# Patient Record
Sex: Female | Born: 1960 | Race: White | Hispanic: No | Marital: Married | Smoking: Former smoker
Health system: Southern US, Community
[De-identification: ages and names within clinical notes are randomized; demographics above are authoritative.]

## PROBLEM LIST (undated history)

## (undated) DIAGNOSIS — J869 Pyothorax without fistula: Secondary | ICD-10-CM

## (undated) DIAGNOSIS — Z8701 Personal history of pneumonia (recurrent): Secondary | ICD-10-CM

## (undated) DIAGNOSIS — I8390 Asymptomatic varicose veins of unspecified lower extremity: Secondary | ICD-10-CM

## (undated) DIAGNOSIS — E871 Hypo-osmolality and hyponatremia: Secondary | ICD-10-CM

## (undated) HISTORY — PX: VARICOSE VEIN SURGERY: SHX832

## (undated) HISTORY — DX: Personal history of pneumonia (recurrent): Z87.01

## (undated) HISTORY — DX: Hypo-osmolality and hyponatremia: E87.1

## (undated) HISTORY — DX: Asymptomatic varicose veins of unspecified lower extremity: I83.90

## (undated) HISTORY — DX: Pyothorax without fistula: J86.9

---

## 1997-05-10 ENCOUNTER — Inpatient Hospital Stay (HOSPITAL_COMMUNITY): Admission: AD | Admit: 1997-05-10 | Discharge: 1997-05-12 | Payer: Self-pay | Admitting: *Deleted

## 1997-06-20 ENCOUNTER — Other Ambulatory Visit: Admission: RE | Admit: 1997-06-20 | Discharge: 1997-06-20 | Payer: Self-pay | Admitting: *Deleted

## 1997-07-01 ENCOUNTER — Encounter (HOSPITAL_COMMUNITY): Admission: RE | Admit: 1997-07-01 | Discharge: 1997-09-29 | Payer: Self-pay | Admitting: *Deleted

## 1998-02-02 ENCOUNTER — Ambulatory Visit (HOSPITAL_COMMUNITY): Admission: RE | Admit: 1998-02-02 | Discharge: 1998-02-02 | Payer: Self-pay | Admitting: Obstetrics and Gynecology

## 1998-02-02 ENCOUNTER — Encounter: Payer: Self-pay | Admitting: Obstetrics and Gynecology

## 1998-06-26 ENCOUNTER — Other Ambulatory Visit: Admission: RE | Admit: 1998-06-26 | Discharge: 1998-06-26 | Payer: Self-pay | Admitting: Obstetrics and Gynecology

## 1999-07-10 ENCOUNTER — Other Ambulatory Visit: Admission: RE | Admit: 1999-07-10 | Discharge: 1999-07-10 | Payer: Self-pay | Admitting: Obstetrics and Gynecology

## 2000-04-24 ENCOUNTER — Encounter: Payer: Self-pay | Admitting: Vascular Surgery

## 2000-04-28 ENCOUNTER — Encounter (INDEPENDENT_AMBULATORY_CARE_PROVIDER_SITE_OTHER): Payer: Self-pay | Admitting: Specialist

## 2000-04-28 ENCOUNTER — Ambulatory Visit (HOSPITAL_COMMUNITY): Admission: RE | Admit: 2000-04-28 | Discharge: 2000-04-28 | Payer: Self-pay | Admitting: Vascular Surgery

## 2000-08-04 ENCOUNTER — Other Ambulatory Visit: Admission: RE | Admit: 2000-08-04 | Discharge: 2000-08-04 | Payer: Self-pay | Admitting: Obstetrics and Gynecology

## 2001-05-27 ENCOUNTER — Other Ambulatory Visit: Admission: RE | Admit: 2001-05-27 | Discharge: 2001-05-27 | Payer: Self-pay | Admitting: Obstetrics and Gynecology

## 2002-01-06 ENCOUNTER — Inpatient Hospital Stay (HOSPITAL_COMMUNITY): Admission: AD | Admit: 2002-01-06 | Discharge: 2002-01-08 | Payer: Self-pay | Admitting: Obstetrics and Gynecology

## 2002-02-10 ENCOUNTER — Other Ambulatory Visit: Admission: RE | Admit: 2002-02-10 | Discharge: 2002-02-10 | Payer: Self-pay | Admitting: Obstetrics and Gynecology

## 2002-08-02 ENCOUNTER — Encounter: Payer: Self-pay | Admitting: Obstetrics and Gynecology

## 2002-08-02 ENCOUNTER — Ambulatory Visit (HOSPITAL_COMMUNITY): Admission: RE | Admit: 2002-08-02 | Discharge: 2002-08-02 | Payer: Self-pay | Admitting: Obstetrics and Gynecology

## 2003-04-04 ENCOUNTER — Other Ambulatory Visit: Admission: RE | Admit: 2003-04-04 | Discharge: 2003-04-04 | Payer: Self-pay | Admitting: Obstetrics and Gynecology

## 2003-08-11 ENCOUNTER — Ambulatory Visit (HOSPITAL_COMMUNITY): Admission: RE | Admit: 2003-08-11 | Discharge: 2003-08-11 | Payer: Self-pay | Admitting: Obstetrics and Gynecology

## 2004-04-02 ENCOUNTER — Encounter: Admission: RE | Admit: 2004-04-02 | Discharge: 2004-04-02 | Payer: Self-pay | Admitting: Obstetrics and Gynecology

## 2004-08-20 ENCOUNTER — Ambulatory Visit (HOSPITAL_COMMUNITY): Admission: RE | Admit: 2004-08-20 | Discharge: 2004-08-20 | Payer: Self-pay | Admitting: Obstetrics and Gynecology

## 2005-04-17 ENCOUNTER — Encounter: Admission: RE | Admit: 2005-04-17 | Discharge: 2005-04-17 | Payer: Self-pay | Admitting: Obstetrics and Gynecology

## 2005-04-24 ENCOUNTER — Encounter: Admission: RE | Admit: 2005-04-24 | Discharge: 2005-04-24 | Payer: Self-pay | Admitting: Obstetrics and Gynecology

## 2005-05-15 ENCOUNTER — Encounter: Admission: RE | Admit: 2005-05-15 | Discharge: 2005-05-15 | Payer: Self-pay | Admitting: Obstetrics and Gynecology

## 2005-08-25 ENCOUNTER — Ambulatory Visit (HOSPITAL_COMMUNITY): Admission: RE | Admit: 2005-08-25 | Discharge: 2005-08-25 | Payer: Self-pay | Admitting: Obstetrics and Gynecology

## 2005-12-02 ENCOUNTER — Encounter: Admission: RE | Admit: 2005-12-02 | Discharge: 2005-12-02 | Payer: Self-pay | Admitting: Diagnostic Radiology

## 2006-07-02 ENCOUNTER — Encounter: Admission: RE | Admit: 2006-07-02 | Discharge: 2006-07-02 | Payer: Self-pay | Admitting: Obstetrics and Gynecology

## 2007-07-06 ENCOUNTER — Encounter: Admission: RE | Admit: 2007-07-06 | Discharge: 2007-07-06 | Payer: Self-pay | Admitting: Obstetrics and Gynecology

## 2007-12-01 ENCOUNTER — Encounter: Admission: RE | Admit: 2007-12-01 | Discharge: 2007-12-01 | Payer: Self-pay | Admitting: Interventional Radiology

## 2008-03-01 ENCOUNTER — Ambulatory Visit: Payer: Self-pay | Admitting: Vascular Surgery

## 2008-06-21 ENCOUNTER — Ambulatory Visit: Payer: Self-pay | Admitting: Vascular Surgery

## 2008-07-10 ENCOUNTER — Encounter: Admission: RE | Admit: 2008-07-10 | Discharge: 2008-07-10 | Payer: Self-pay | Admitting: Obstetrics and Gynecology

## 2008-10-18 ENCOUNTER — Ambulatory Visit: Payer: Self-pay | Admitting: Vascular Surgery

## 2008-11-01 ENCOUNTER — Ambulatory Visit: Payer: Self-pay | Admitting: Vascular Surgery

## 2009-01-26 ENCOUNTER — Ambulatory Visit: Payer: Self-pay | Admitting: Vascular Surgery

## 2009-07-19 ENCOUNTER — Encounter: Admission: RE | Admit: 2009-07-19 | Discharge: 2009-07-19 | Payer: Self-pay | Admitting: Obstetrics and Gynecology

## 2010-02-10 ENCOUNTER — Encounter: Payer: Self-pay | Admitting: Obstetrics and Gynecology

## 2010-02-10 ENCOUNTER — Encounter: Payer: Self-pay | Admitting: Interventional Radiology

## 2010-05-13 ENCOUNTER — Other Ambulatory Visit: Payer: Self-pay | Admitting: Family Medicine

## 2010-05-13 ENCOUNTER — Ambulatory Visit
Admission: RE | Admit: 2010-05-13 | Discharge: 2010-05-13 | Disposition: A | Discharge status: Home / Self Care | Payer: BC Managed Care – PPO | Source: Ambulatory Visit | Attending: Family Medicine | Admitting: Family Medicine

## 2010-05-13 DIAGNOSIS — J189 Pneumonia, unspecified organism: Secondary | ICD-10-CM

## 2010-06-04 ENCOUNTER — Ambulatory Visit
Admission: RE | Admit: 2010-06-04 | Discharge: 2010-06-04 | Disposition: A | Discharge status: Home / Self Care | Payer: BC Managed Care – PPO | Source: Ambulatory Visit | Attending: Family Medicine | Admitting: Family Medicine

## 2010-06-04 ENCOUNTER — Other Ambulatory Visit: Payer: Self-pay | Admitting: Family Medicine

## 2010-06-04 DIAGNOSIS — J189 Pneumonia, unspecified organism: Secondary | ICD-10-CM

## 2010-06-04 NOTE — Assessment & Plan Note (Signed)
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  She has a large chain of  varicosities beginning in her left groin, extending over the anterior  thigh to the lateral knee and down into her lateral calf and ankle.  She  has worn her compression garments for 3 months.  She reports that these  have not given her any significant improvement.  She continues to have  pain and swelling which makes housework and childcare very difficult.  She has had to stop running for exercise because of the leg pain and  swelling as well.  She had undergone noninvasive vascular laboratory  studies in her last visit in February.  This showed that her great  saphenous vein on the left was patent and did not have any evidence of  reflux.  I explained, therefore, that she would not need any treatment  for her saphenous vein but would recommend a stab phlebectomy of the  extensive varicosities throughout her thigh, calf and down onto her  ankle.  She understands this and will notify us when she can proceed  with this around her regular childcare needs.   Larina Earthly, M.D.  Electronically Signed   TFE/MEDQ  D:  06/21/2008  T:  06/22/2008  Job:  [id number]   cc:   Gretta Arab. Valentina Lucks, M.D.  Sherry A. Rosalio Macadamia, M.D.

## 2010-06-04 NOTE — Procedures (Signed)
LOWER EXTREMITY VENOUS REFLUX EXAM   INDICATION:  Left lower extremity spider veins.   EXAM:  Using color-flow imaging and pulse Doppler spectral analysis, the  left common femoral, superficial femoral, popliteal, posterior tibial,  greater and lesser veins are evaluated.  There is no evidence suggesting  deep venous insufficiency in the left lower extremity.   The left saphenofemoral junction is not competent.  The left GSV is  competent with the caliber as described below.   The left proximal short saphenous vein demonstrates competency.   GSV Diameter (used if found to be incompetent only)                                            Right    Left  Proximal Greater Saphenous Vein           cm       cm  Proximal-to-mid-thigh                     cm       cm  Mid thigh                                 cm       cm  Mid-distal thigh                          cm       cm  Distal thigh                              cm       cm  Knee                                      cm       cm   IMPRESSION:  1. The left greater saphenous vein does not demonstrate reflux.  2. The left greater saphenous vein is not aneurysmal.  3. The left greater saphenous vein is not tortuous.  4. The deep venous system is competent.  5. The left lesser saphenous vein is competent.  6. Sonographic evidence of left lateral greater saphenous vein branch      demonstrates reflux ranging from 0.55 - 0.38 cm from groin to      ankle.        ___________________________________________  Larina Earthly, M.D.   AC/MEDQ  D:  03/01/2008  T:  03/01/2008  Job:  [id number]

## 2010-06-04 NOTE — Assessment & Plan Note (Signed)
OFFICE VISIT   LACINDA, CURVIN  DOB:  1960-07-09                                       11/01/2008  ZOXWR#:[id number]   The patient presents today for followup of her stab phlebectomy of  multiple tributary varicosities from her left groin, lateral thigh and  lateral calf.  She has the usual amount of mild soreness from this.  Did  have significant bruising throughout the area of treatment.  This is  resolving.  I am pleased with her initial result as is the patient.  Plan to see her again in 6 weeks for final followup.   Larina Earthly, M.D.  Electronically Signed   TFE/MEDQ  D:  11/01/2008  T:  11/02/2008  Job:  [id number]

## 2010-06-04 NOTE — Assessment & Plan Note (Signed)
OFFICE VISIT   LEATTA, ALEWINE  DOB:  21-May-1960                                       01/26/2009  ZOXWR#:[id number]   Patient presents today for follow-up of her stab phlebectomy of multiple  tributary varicosities that extended from her left groin over her  anterior thigh to the lateral knee and down her lateral calf.  She has  had good results.   She has resolved the discomfort she had initially after the procedure.  The bruising throughout the treatment area continues to resolve.  The  phlebectomy sites themselves are healing nicely.  She does have some  telangiectasia more distally but understands these could be treated with  sclerotherapy if desired.   She will continue her usual activities and see [place] again on an as-needed  basis.     Larina Earthly, M.D.  Electronically Signed   TFE/MEDQ  D:  01/26/2009  T:  01/29/2009  Job:  [id number]

## 2010-06-04 NOTE — Assessment & Plan Note (Signed)
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  She has a history of prior right leg great saphenous vein ligation and  stripping from her groin to her below knee position on the right and  removal of tributary varicosities by myself in April of 2002.  She has  done well with her right leg.  Since this time over the past 8 years she  has developed additional varicosities over her left anterior thigh  extending down to her lateral calf.  She does not have any history of  deep venous thrombosis.  She does have pain associated with this and  does have swelling distal to this as well.  She is quite active and  reports that she has pain with standing and with activity over this area  of her left thigh and calf.  She did have attempted treatment of this at  an outlying vein center.  Apparently she had attempted ablation of this  area of her anterior thigh with unsuccessful treatment.  This was  approximately 2 years ago.  She does not have any have bleeding  associated with this.   PAST MEDICAL HISTORY:  Is otherwise unremarkable.   MEDICATIONS:  Her only medications Loestrin.   ALLERGIES:  She has no known drug allergies.   She does not have a history of diabetes, hypertension or cardiac  disease.   FAMILY HISTORY:  Is negative for premature atherosclerotic disease.   SOCIAL HISTORY:  She is married with 3 children.  Her husband is Dr.  Donzetta Starch with [employer].  She does not smoke, having quit  in 1991 and has a glass of wine per day.   REVIEW OF SYSTEMS:  Weight is reported 130 pounds.  She is 5 feet 8  inches tall.  Review of systems is otherwise completely negative.   PHYSICAL EXAM:  General:  A well-developed, well-nourished white female  appearing her stated age of 62.  Vital signs:  Her blood  pressure is  121/72, pulse 62.  Her radial and dorsalis pedis pulses are 2+  bilaterally.  Her right leg does have some scattered telangiectasia but  no significant varicosities.  On the left leg she has marked  varicosities beginning in her groin extending over her anterior thigh to  her lateral knee and then down her lateral calf.   She underwent a formal venous duplex today in our office and this  reveals normal competent left great saphenous vein.  The anterior branch  across her thigh does arise at the saphenofemoral junction but does not  have any common trunk with the great saphenous vein.  She does have  reflux throughout this tributary branch.  I discussed options with the  patient.  I explained that our recommendation would be stab phlebectomy  for relief of the discomfort related to her venous varicosities.  I  would not recommend any attempt at ablation of her great saphenous vein  since this is normal and also do not feel she could have successful  ablation of this area.  It would have better functional recovery with  stab phlebectomy.  I explained that the procedure would be done as  an  outpatient in our office under local anesthesia and would take  approximately 1 hour.  She had worn graduated compression garments in  the past but had not worn these recently.  She was given a prescription  today for thigh-high 20-30 mmHg graduated compression garments.  She  will begin using these to determine if this does give her symptomatic  relief of the pain associated with her varicosities over her left leg.  We will see her again in 3 months for further discussion.   Larina Earthly, M.D.  Electronically Signed   TFE/MEDQ  D:  03/01/2008  T:  03/02/2008  Job:  [id number]   cc:   Gretta Arab. Valentina Lucks, M.D.  Sherry A. Rosalio Macadamia, M.D.

## 2010-06-07 NOTE — Op Note (Signed)
[place]  Patient:    Kiara [name], Kiara [name]                        MRN: [id number] Adm. Date:  [id number] Attending:  Alyson Locket                           Operative Report  PROCEDURE:  Right arm arteriovenous fistula creation.  SURGEON:  Larina Earthly, M.D.  ASSISTANT:  Nurse.  ANESTHESIA:  1% lidocaine local.  COMPLICATIONS:  None.  DISPOSITION:  To recovery room stable.  DESCRIPTION OF PROCEDURE:  The patient was taken to the operating room and placed in the supine position, where the area of the right arm was prepped and draped in the usual sterile fashion.  Incision was made over the level of the cephalic vein at the wrist, and the vein was exposed and found to be unusable, too small for bypass.  For this reason, a separate incision was made using local anesthesia over the antecubital space, carried down to isolate the cephalic vein, which was of good caliber.  The vein was mobilized, ligated distally with 2-0 silk ties, and divided.  The vein was mobilized to the level of the brachial artery at the antecubital space.  The artery was occluded proximally and distally and was opened with an 11 blade and extended longitudinally with Potts scissors.  The graft was sewn end-to-side to the artery with running 6-0 Prolene suture.  Clamps were removed, and an excellent thrill was noted.  The wounds were irrigated with saline and hemostased with electrocautery.  Wounds were closed with 3-0 Vicryl in the subcutaneous and subcuticular tissue.  Benzoin and Steri-Strips were applied. DD:  04/28/00 TD:  04/28/00 Job: [id number]

## 2010-06-07 NOTE — Op Note (Signed)
[place]  Patient:    Kiara [name], Kiara [name]                        MRN: [id number] Proc. Date: 04/28/00 Adm. Date:  [id number] Attending:  Alyson Locket                           Operative Report  PREOPERATIVE DIAGNOSIS:  Bilateral painful venous varicosities.  POSTOPERATIVE DIAGNOSIS:  Bilateral painful venous varicosities.  PROCEDURES: 1. Right greater saphenous vein ligation and stripping from the groin to the    below-knee position. 2. Tributary varicosity removal with Trivex system.  SURGEON:  Larina Earthly, M.D.  ASSISTANT:  Sherrie George, P.A.  ANESTHESIA:  LMA.  COMPLICATIONS:  None.  DISPOSITION:  To recovery room stable.  DESCRIPTION OF PROCEDURE:  The patient was taken to the operating room and placed in the supine position, where the area of the right groin and right leg were prepped and draped in the usual sterile fashion.  An incision was made over the saphenofemoral junction and carried down to isolate the saphenofemoral junction.  Tributary branches were ligated with 3-0 Vicryl ties and divided.  The saphenous vein was doubly ligated at its takeoff from the femoral vein.  The vein was encircled with a Vicryl tie, and incision was made in the vein near the saphenofemoral junction.  The stripper was passed retrograde through the incompetent valves down to the level of the below-knee position.  A separate incision was made at this level, and the saphenous vein was ligated at this level and divided, and the stripper was then exposed through the same incision.  Next, using the Trivex system, separate stab incisions were made around the premarked varicosities.  Using tumescent solution with epinephrine and lidocaine and subcutaneous illumination, the Trivex system was used to remove the tributary saphenous veins over the lateral and medial calf and below-knee position.  These also extended down to the dorsum of the foot and  ankle, and these were removed as well.  Hemostasis was obtained with pressure.  After all tributary varicosities were removed, the saphenous vein was stripped using a vein pressure, and pressure was held for hemostasis.  The proximal and distal incision for the greater saphenous vein stripping were closed with subcutaneous Vicryl sutures.  The Trivex avulsion incisions were closed with benzoin and Steri-Strips.  A Kerlix and Coban dressing were applied, and the patient was taken to the recovery room in stable condition. DD:  04/28/00 TD:  04/28/00 Job: [id number]

## 2010-06-07 NOTE — H&P (Signed)
   NAMEMarland [name]  [name], [name]                           ACCOUNT NO.:  [id number]   MEDICAL RECORD NO.:  [id number]                   PATIENT TYPE:  INP   LOCATION:  9172                                 FACILITY:  [facility]   PHYSICIAN:  Lenoard Aden, M.D.             DATE OF BIRTH:  22-Jun-1960   DATE OF ADMISSION:  01/06/2002  DATE OF DISCHARGE:                                HISTORY & PHYSICAL   CHIEF COMPLAINT:  Pregnancy induced hypertension at term.   HISTORY OF PRESENT ILLNESS:  The patient is a 50 year old white female, G3,  P2, EDD of January 12, 2002 at 39+ weeks and blood pressure 120-130/90 in  the office today.   PAST MEDICAL HISTORY:  History of LATEX sensitivity.   MEDICATIONS:  Prenatal vitamins.   PAST OBSTETRICAL HISTORY:  History of a 7 pound 4 ounce female in 1996 and a  7 pound 10 ounce female in 1999.   PAST SURGICAL HISTORY:  1. History of varicose vein surgery.  2. History of surgery on her left arm.   No other medical or surgical hospitalizations.   FAMILY HISTORY:  Myocardial infarction, hypertension, leukemia, bone cancer,  and sarcoma.   PRENATAL LABORATORY DATA:  Blood type is A positive.  Rubella immune.  Hepatitis B surface antigen negative.  HIV nonreactive.  GBS positive.   PHYSICAL EXAMINATION:  GENERAL:  Well-developed, well-nourished white female  in no apparent distress.  VITAL SIGNS:  Blood pressure 121/75.  HEENT:  Normal.  LUNGS:  Clear.  HEART:  Regular rhythm.  ABDOMEN:  Soft.  Gravid.  Nontender.  Estimated fetal weight on ultrasound  today 8 pounds 5 ounces.  CERVICAL EXAMINATION:  Cervix is 3 cm, 80%, vertex and 0 station.  EXTREMITIES:  Show no cord.  NEUROLOGIC EXAMINATION:  Nonfocal.   IMPRESSION:  1. Thirty-nine week intrauterine pregnancy.  2. Pregnancy induced hypertension.  3. Advanced maternal age with normal amniocentesis.   PLAN:  1. Admit.  2. Penicillin prophylaxis.  3. Low-dose Pitocin.  4. Anticipated attempts at  vaginal delivery.  5. Epidural p.r.n.                                                 Lenoard Aden, M.D.    RJT/MEDQ  D:  01/06/2002  T:  01/07/2002  Job:  [id number]

## 2010-06-14 ENCOUNTER — Other Ambulatory Visit: Payer: Self-pay | Admitting: Family Medicine

## 2010-06-14 DIAGNOSIS — Z1231 Encounter for screening mammogram for malignant neoplasm of breast: Secondary | ICD-10-CM

## 2010-06-18 ENCOUNTER — Other Ambulatory Visit: Payer: Self-pay | Admitting: Family Medicine

## 2010-06-18 ENCOUNTER — Ambulatory Visit
Admission: RE | Admit: 2010-06-18 | Discharge: 2010-06-18 | Disposition: A | Discharge status: Home / Self Care | Payer: BC Managed Care – PPO | Source: Ambulatory Visit | Attending: Family Medicine | Admitting: Family Medicine

## 2010-06-18 DIAGNOSIS — J189 Pneumonia, unspecified organism: Secondary | ICD-10-CM

## 2010-06-19 ENCOUNTER — Other Ambulatory Visit: Payer: Self-pay | Admitting: Family Medicine

## 2010-06-19 DIAGNOSIS — R9389 Abnormal findings on diagnostic imaging of other specified body structures: Secondary | ICD-10-CM

## 2010-06-20 ENCOUNTER — Ambulatory Visit
Admission: RE | Admit: 2010-06-20 | Discharge: 2010-06-20 | Disposition: A | Discharge status: Home / Self Care | Payer: BC Managed Care – PPO | Source: Ambulatory Visit | Attending: Family Medicine | Admitting: Family Medicine

## 2010-06-20 DIAGNOSIS — R9389 Abnormal findings on diagnostic imaging of other specified body structures: Secondary | ICD-10-CM

## 2010-06-20 MED ORDER — IOHEXOL 300 MG/ML  SOLN
75.0000 mL | Freq: Once | INTRAMUSCULAR | Status: AC | PRN
  Administered 2010-06-20: 75 mL via INTRAVENOUS

## 2010-06-25 ENCOUNTER — Encounter (INDEPENDENT_AMBULATORY_CARE_PROVIDER_SITE_OTHER): Payer: BC Managed Care – PPO | Admitting: Thoracic Surgery

## 2010-06-25 DIAGNOSIS — J869 Pyothorax without fistula: Secondary | ICD-10-CM

## 2010-06-26 NOTE — Letter (Signed)
June 25, 2010  Gretta Arab. Valentina Lucks, MD [address] [place] [address]  Re:  Kiara [name], Kiara [name]                 DOB:  1960/07/05  Dear Consuella Lose,  I appreciate the opportunity of seeing this patient.  This is a 50 year old patient who was down in [place] where she developed left lower lobe pneumonia and pleuritic chest pain and had a thoracentesis there and was also had hyponatremia with sodiums of 126-127.  She was admitted to [facility] in [place], [place].  After that, she was seen in April 2010, after that she came home and was placed on antibiotics and a chest x-ray showed a persistent left pleural effusion.  The CT scan done on May 31, showed a left lower lobe loculated empyema with chronic empyema with pleural thickening.  She still has a sodium of 132.  She is a Designer, fashion/clothing.  She is referred here for evaluation.  MEDICATIONS:  Ibuprofen, Claritin, and Loestrin.  ALLERGIES:  She is allergic to LATEX.  FAMILY HISTORY:  Noncontributory.  SOCIAL HISTORY:  She was in my practice about 12 years ago, did practice here in [place].  Quit smoking 20 years ago.  Does not drink alcohol on a regular basis.  REVIEW OF SYSTEMS:  VITAL SIGNS:  She has had some weight loss.  She is 130 pounds.  She 5 feet 8 inches. CARDIAC:  She has shortness of breath with exertion. PULMONARY:  As go. GI:  No nausea, vomiting, constipation, diarrhea. GU:  No kidney disease, dysuria or frequent urination. VASCULAR:  No claudication, DVT, or TIAs.  She has had surgery for varicose veins, Dr. Pete Pelt. NEUROLOGICAL:  No dizziness, headaches, or blackouts. MUSCULOSKELETAL:  Arthritis. PSYCHIATRIC:  No depression or nervous. EYE/ENT:  No change in eyesight or hearing. HEMOLOGICAL:  No problems with bleeding and clotting disorders.  PHYSICAL EXAMINATION:  GENERAL:  She is a well-developed Caucasian female in no acute distress.  VITAL  SIGNS:  Her blood pressure is 112/72, pulse 70, respirations 18, and sats were 97%.  HEAD, EYES, EARS, NOSE, AND THROAT:  Unremarkable.  NECK:  Supple without thyromegaly. There is no supraclavicular or axillary adenopathy.  CHEST:  Clear to auscultation and percussion.  HEART:  Regular sinus rhythm.  No murmur. There is no hepatosplenomegaly.  EXTREMITIES:  Pulses are 2+.  There is no clubbing or edema.  NEUROLOGICAL:  She is oriented x3.  Sensory and motor intact.  Cranial nerves intact.  Unfortunately, I feel that she is going to need a decortication since she has got a chronic empyema and we will plan routinely schedule this to do on June 11 at [facility].  She will be in the hospital approximately 4-6 days.  I have explained the risk of procedure to her and she understands and agrees to surgery.  I appreciate the opportunity of seeing the patient.  Ines Bloomer, M.D. Electronically Signed  DPB/MEDQ  D:  06/25/2010  T:  06/26/2010  Job:  [id number]

## 2010-06-27 ENCOUNTER — Other Ambulatory Visit (HOSPITAL_COMMUNITY): Payer: BC Managed Care – PPO

## 2010-07-03 ENCOUNTER — Other Ambulatory Visit: Payer: Self-pay | Admitting: Thoracic Surgery

## 2010-07-03 ENCOUNTER — Ambulatory Visit (HOSPITAL_COMMUNITY)
Admission: RE | Admit: 2010-07-03 | Discharge: 2010-07-03 | Disposition: A | Discharge status: Home / Self Care | Payer: BC Managed Care – PPO | Source: Ambulatory Visit | Attending: Thoracic Surgery | Admitting: Thoracic Surgery

## 2010-07-03 ENCOUNTER — Encounter (HOSPITAL_COMMUNITY)
Admission: RE | Admit: 2010-07-03 | Discharge: 2010-07-03 | Disposition: A | Discharge status: Home / Self Care | Payer: BC Managed Care – PPO | Source: Ambulatory Visit | Attending: Thoracic Surgery | Admitting: Thoracic Surgery

## 2010-07-03 DIAGNOSIS — J90 Pleural effusion, not elsewhere classified: Secondary | ICD-10-CM | POA: Insufficient documentation

## 2010-07-03 DIAGNOSIS — J869 Pyothorax without fistula: Secondary | ICD-10-CM

## 2010-07-03 DIAGNOSIS — Z01812 Encounter for preprocedural laboratory examination: Secondary | ICD-10-CM | POA: Insufficient documentation

## 2010-07-03 DIAGNOSIS — Z01818 Encounter for other preprocedural examination: Secondary | ICD-10-CM | POA: Insufficient documentation

## 2010-07-03 LAB — SURGICAL PCR SCREEN
MRSA, PCR: POSITIVE — AB
Staphylococcus aureus: POSITIVE — AB

## 2010-07-03 LAB — BLOOD GAS, ARTERIAL
Acid-base deficit: 1.3 mmol/L (ref 0.0–2.0)
O2 Saturation: 97.3 %
pO2, Arterial: 88 mmHg (ref 80.0–100.0)

## 2010-07-03 LAB — COMPREHENSIVE METABOLIC PANEL
ALT: 12 U/L (ref 0–35)
BUN: 14 mg/dL (ref 6–23)
CO2: 21 mEq/L (ref 19–32)
Calcium: 8.8 mg/dL (ref 8.4–10.5)
Creatinine, Ser: 0.56 mg/dL (ref 0.4–1.2)
GFR calc Af Amer: >60 mL/min (ref >60)
GFR calc non Af Amer: >60 mL/min (ref >60)
Glucose, Bld: 89 mg/dL (ref 70–99)

## 2010-07-03 LAB — URINALYSIS, ROUTINE W REFLEX MICROSCOPIC
Bilirubin Urine: NEGATIVE
Ketones, ur: NEGATIVE mg/dL
Protein, ur: NEGATIVE mg/dL
Specific Gravity, Urine: 1.004 — ABNORMAL LOW (ref 1.005–1.030)
pH: 7 (ref 5.0–8.0)

## 2010-07-03 LAB — URINE MICROSCOPIC-ADD ON

## 2010-07-03 LAB — CBC
HCT: 38.7 % (ref 36.0–46.0)
MCV: 91.5 fL (ref 78.0–100.0)
RDW: 12.9 % (ref 11.5–15.5)

## 2010-07-03 LAB — ABO/RH: ABO/RH(D): A POS

## 2010-07-03 NOTE — H&P (Signed)
NAMEMarland Kitchen  Kiara [name], Kiara [name] NO.:  [id number]  MEDICAL RECORD NO.:  [id number]  LOCATION:                                 FACILITY:  PHYSICIAN:  Ines Bloomer, M.D.      DATE OF BIRTH:  DATE OF ADMISSION: DATE OF DISCHARGE:                             HISTORY & PHYSICAL   CHIEF COMPLAINT:  Empyema.  HISTORY OF PRESENT ILLNESS:  This is a 50 year old Caucasian female who lives in [place] with her kids on spring break when she developed left chest pain and fever and chills and was admitted to [facility] with a left lower lobe pneumonia and sepsis, required a thoracentesis. She also had hyponatremia at that time and was discharged and referred back to her medical doctor, Dr. Maurice Small.  Chest x-ray on May 17, 2010, showed a persistent left pleural effusion which remained on followup chest x-ray, the CT scan on May 31 showed a 10 x 8 cm lesion in the posterior segment of the left lower lobe and near the superior segment of the left lower lobe with marked scarring of the left lower lobe.  He had no recent fever, chills, though had some mild left chest pain.  She was treated with Rocephin and IV Levaquin.  PAST MEDICAL HISTORY:  She has had varicose vein surgeries by Dr. Gretta Began.  ALLERGIES:  She is allergic to LATEX.  MEDICATION:  Include ibuprofen, Claritin, Elestrin.  FAMILY HISTORY:  Noncontributory.  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  She smoked 20 years ago. Does not drink.  Has occasional alcohol intake.  REVIEW OF SYSTEMS:  She is 130 pounds and she is 5 feet 8 inches.  She has had some weight loss of 4 pounds with the illness.  CARDIA:  No angina or atrial fibrillation.  PULMONARY:  Shortness of breath with exertion.  No hemoptysis, asthma or bronchitis.  GI:  No nausea, vomiting, constipation, diarrhea.  GU:  No kidney disease, dysuria or frequent  urination.  VASCULAR:  No claudication, DVT, TIAs and has previous varicose vein surgery.  NEUROLOGICAL:  No dizziness, headaches, blackouts, seizures.  MUSCULOSKELETAL:  No arthritis or joint pain. PSYCHIATRIC:  No depression or nervousness.  ENT:  No change in eyesight or hearing.  HEMATOLOGICAL:  No problems with bleeding, clotting disorders or anemia.  PHYSICAL EXAMINATION:  VITAL SIGNS:  Her blood pressure is 112/72, pulse 70, respirations 18, sats were 98%. HEENT:  Head is atraumatic.  Eyes:  Pupils equal, reactive to light and accommodation.  Extraocular movements are normal.  Ears: Tympanic membranes are intact.  Nose: There is no septal deviation.  Throat: Without lesion. NECK:  Supple without thyromegaly.  There is no supraclavicular or axillary adenopathy. CHEST:  Decreased breath sounds left lower lobe. HEART:  Regular sinus rhythm.  No murmur. ABDOMEN:  Soft.  No hepatosplenomegaly. EXTREMITIES:  Pulses 2+.  There is no clubbing or edema. NEUROLOGICAL:  He is oriented x3.  Sensory and motor intact.  Cranial nerves intact.  IMPRESSION: 1. Chronic empyema left lower lobe, status post left lower lobe  pneumonia. 2. Hyponatremia.  PLAN:  Left VATS with left decortication.     Ines Bloomer, M.D.     DPB/MEDQ  D:  06/25/2010  T:  06/26/2010  Job:  [id number]  Electronically Signed by Jovita Gamma M.D. on 07/03/2010 04:56:01 PM

## 2010-07-05 ENCOUNTER — Inpatient Hospital Stay (HOSPITAL_COMMUNITY): Payer: BC Managed Care – PPO

## 2010-07-05 ENCOUNTER — Inpatient Hospital Stay (HOSPITAL_COMMUNITY)
Admission: RE | Admit: 2010-07-05 | Discharge: 2010-07-10 | DRG: 075 | Disposition: A | Discharge status: Home / Self Care | Payer: BC Managed Care – PPO | Source: Ambulatory Visit | Attending: Thoracic Surgery | Admitting: Thoracic Surgery

## 2010-07-05 ENCOUNTER — Other Ambulatory Visit: Payer: Self-pay | Admitting: Thoracic Surgery

## 2010-07-05 DIAGNOSIS — Z5332 Thoracoscopic surgical procedure converted to open procedure: Secondary | ICD-10-CM

## 2010-07-05 DIAGNOSIS — Z91040 Latex allergy status: Secondary | ICD-10-CM

## 2010-07-05 DIAGNOSIS — J869 Pyothorax without fistula: Secondary | ICD-10-CM

## 2010-07-05 DIAGNOSIS — E871 Hypo-osmolality and hyponatremia: Secondary | ICD-10-CM | POA: Diagnosis present

## 2010-07-05 DIAGNOSIS — R091 Pleurisy: Secondary | ICD-10-CM | POA: Diagnosis present

## 2010-07-05 DIAGNOSIS — Z87891 Personal history of nicotine dependence: Secondary | ICD-10-CM

## 2010-07-05 LAB — GRAM STAIN

## 2010-07-06 ENCOUNTER — Inpatient Hospital Stay (HOSPITAL_COMMUNITY): Payer: BC Managed Care – PPO

## 2010-07-06 HISTORY — PX: OTHER SURGICAL HISTORY: SHX169

## 2010-07-06 LAB — CBC
Hemoglobin: 10.4 g/dL — ABNORMAL LOW (ref 12.0–15.0)
MCH: 31.8 pg (ref 26.0–34.0)
RBC: 3.27 MIL/uL — ABNORMAL LOW (ref 3.87–5.11)

## 2010-07-06 LAB — POCT I-STAT 3, ART BLOOD GAS (G3+)
Bicarbonate: 21.5 mEq/L (ref 20.0–24.0)
O2 Saturation: 97 %
pCO2 arterial: 37.2 mmHg (ref 35.0–45.0)
pO2, Arterial: 88 mmHg (ref 80.0–100.0)

## 2010-07-06 LAB — BASIC METABOLIC PANEL
CO2: 23 mEq/L (ref 19–32)
Calcium: 7.5 mg/dL — ABNORMAL LOW (ref 8.4–10.5)
Glucose, Bld: 146 mg/dL — ABNORMAL HIGH (ref 70–99)
Potassium: 3.7 mEq/L (ref 3.5–5.1)
Sodium: 132 mEq/L — ABNORMAL LOW (ref 135–145)

## 2010-07-06 NOTE — Op Note (Signed)
Kiara [name], Kiara [name] NO.:  [id number]  MEDICAL RECORD NO.:  [id number]  LOCATION:  2311                         FACILITY:  [facility]  PHYSICIAN:  Ines Bloomer, M.D. DATE OF BIRTH:  July 01, 1960  DATE OF PROCEDURE:  07/05/2010 DATE OF DISCHARGE:                              OPERATIVE REPORT   PREOPERATIVE DIAGNOSIS:  Chronic empyema.  POSTOPERATIVE DIAGNOSIS:  Chronic empyema.  OPERATION PERFORMED:  Attempted left video-assisted thoracoscopic surgery, left thoracotomy, decortication of left lower lobe and resection of empyema cavity.  SURGEON:  Ines Bloomer, MD  ANESTHESIA:  General anesthesia.  After percutaneous insertion of all monitoring lines, the patient underwent general anesthesia, was turned to left lateral thoracotomy position.  This patient had an empyema with pneumonia of the left lower lobe approximately 8 weeks before and then developed effusion which was tapped and then it reoccurred and then had an area of a chronic posterior loculated area, that it was thought to be an empyema cavity in the left lower lobe with scarring of the left lower lobe.  A dual-lumen tube was inserted, the left lung was deflated.  Two trocar sites were made in the anterior and posterior axillary line at the eighth intercostal space and an attempt was made to insert a trocar but on insertion, we feel that it was just a marked amount of adhesions and you could not even get the trocar placed and could not free up the adhesions. So, we then went to a posterolateral thoracotomy to the sixth intercostal space and there was dense adhesions in which we had to carefully dissect off the lung extrapleurally, superiorly and inferiorly, medially and laterally so we were able to get into the space medially, we were able to find the tissue and dissect the left upper lobe and the left lower lobe was divided with the fissure.  We had to use a stapler and partially divide the  fissure.  After we freed the left upper lobe, hence we did not feel we need to do any further lysis of adhesions in the left upper lobe though we first freed up the left lower lobe superior segment and then the medial segment and we dissected laterally and found the empyema cavity which was about 8 cm x 6 cm.  We carefully dissected the lung off the cavity, it was in the lateral wall between the sixth and the eighth ribs and then we completely decorticated the left lower lobe removing all scar tissue off the left lower lobe which sharp and blunt dissection using electrocautery scissors and Kitners.  As the left lower lobe was freed up, there was 2 or 3 areas of tear in the left lower lobe which we repaired with 3-0 Vicryl in interrupted and figure-of-eight fashion.  We next turned our attention to the empyema. We  dissected out extrapleurally, but it was extremely difficult to dissect out and there was some lung stuck inferiorly off this which we divided with a Covidien 45 stapler freeing up the lung and then slowly consisting an extrapleural resection of this cavity from inferiorly to superiorly and medially from laterally to medially and finally dividing the last attachments  medially.  The cavity was then sent for permanent section.  Two chest tubes were placed 24 anteriorly and 28 posteriorly and suture in place to the trocar sites. Single On-Q was done in the usual fashion.  A Marcaine block was done in the usual fashion.  Chest was closed with 4 pericostal drilling through the seventh rib and passed around the sixth rib and #1 Vicryl in the muscle layer, 3-0 Vicryl in the subcutaneous tissue and Dermabond for the skin.  The patient was returned to the recovery room in stable condition.     Ines Bloomer, M.D.     DPB/MEDQ  D:  07/05/2010  T:  07/06/2010  Job:  [id number]  cc:   Gretta Arab. Valentina Lucks, M.D.  Electronically Signed by Jovita Gamma M.D. on 07/06/2010 08:15:15 PM

## 2010-07-07 ENCOUNTER — Inpatient Hospital Stay (HOSPITAL_COMMUNITY): Payer: BC Managed Care – PPO

## 2010-07-07 LAB — CBC
HCT: 29.8 % — ABNORMAL LOW (ref 36.0–46.0)
Hemoglobin: 10.1 g/dL — ABNORMAL LOW (ref 12.0–15.0)
MCH: 31.9 pg (ref 26.0–34.0)
MCHC: 33.9 g/dL (ref 30.0–36.0)
MCV: 94 fL (ref 78.0–100.0)

## 2010-07-07 LAB — COMPREHENSIVE METABOLIC PANEL
Alkaline Phosphatase: 49 U/L (ref 39–117)
BUN: 9 mg/dL (ref 6–23)
Calcium: 7.6 mg/dL — ABNORMAL LOW (ref 8.4–10.5)
GFR calc Af Amer: >60 mL/min (ref >60)
Glucose, Bld: 101 mg/dL — ABNORMAL HIGH (ref 70–99)
Total Protein: 5.1 g/dL — ABNORMAL LOW (ref 6.0–8.3)

## 2010-07-08 ENCOUNTER — Inpatient Hospital Stay (HOSPITAL_COMMUNITY): Payer: BC Managed Care – PPO

## 2010-07-08 LAB — BASIC METABOLIC PANEL
CO2: 28 mEq/L (ref 19–32)
Calcium: 8.3 mg/dL — ABNORMAL LOW (ref 8.4–10.5)
Chloride: 103 mEq/L (ref 96–112)
GFR calc Af Amer: >60 mL/min (ref >60)
Sodium: 135 mEq/L (ref 135–145)

## 2010-07-08 LAB — GLUCOSE, CAPILLARY: Glucose-Capillary: 132 mg/dL — ABNORMAL HIGH (ref 70–99)

## 2010-07-09 ENCOUNTER — Inpatient Hospital Stay (HOSPITAL_COMMUNITY): Payer: BC Managed Care – PPO

## 2010-07-09 LAB — CULTURE, ROUTINE-ABSCESS

## 2010-07-09 LAB — TISSUE CULTURE

## 2010-07-10 ENCOUNTER — Inpatient Hospital Stay (HOSPITAL_COMMUNITY): Payer: BC Managed Care – PPO

## 2010-07-10 LAB — BASIC METABOLIC PANEL
Calcium: 8.5 mg/dL (ref 8.4–10.5)
Chloride: 101 mEq/L (ref 96–112)
Creatinine, Ser: 0.56 mg/dL (ref 0.50–1.10)
GFR calc Af Amer: >60 mL/min (ref >60)
Sodium: 136 mEq/L (ref 135–145)

## 2010-07-10 LAB — TYPE AND SCREEN
ABO/RH(D): A POS
Antibody Screen: NEGATIVE
Unit division: 0

## 2010-07-10 LAB — CBC
MCH: 32.1 pg (ref 26.0–34.0)
MCV: 92.1 fL (ref 78.0–100.0)
Platelets: 257 10*3/uL (ref 150–400)
RBC: 3.18 MIL/uL — ABNORMAL LOW (ref 3.87–5.11)
RDW: 12.8 % (ref 11.5–15.5)
WBC: 4.9 10*3/uL (ref 4.0–10.5)

## 2010-07-15 ENCOUNTER — Other Ambulatory Visit: Payer: Self-pay | Admitting: Thoracic Surgery

## 2010-07-15 DIAGNOSIS — D381 Neoplasm of uncertain behavior of trachea, bronchus and lung: Secondary | ICD-10-CM

## 2010-07-16 ENCOUNTER — Ambulatory Visit (INDEPENDENT_AMBULATORY_CARE_PROVIDER_SITE_OTHER): Payer: Self-pay | Admitting: Thoracic Surgery

## 2010-07-16 ENCOUNTER — Ambulatory Visit
Admission: RE | Admit: 2010-07-16 | Discharge: 2010-07-16 | Disposition: A | Discharge status: Home / Self Care | Payer: BC Managed Care – PPO | Source: Ambulatory Visit | Attending: Thoracic Surgery | Admitting: Thoracic Surgery

## 2010-07-16 DIAGNOSIS — J869 Pyothorax without fistula: Secondary | ICD-10-CM

## 2010-07-16 DIAGNOSIS — D381 Neoplasm of uncertain behavior of trachea, bronchus and lung: Secondary | ICD-10-CM

## 2010-07-17 NOTE — Assessment & Plan Note (Signed)
OFFICE VISIT  Kiara [name], Kiara [name] DOB:  Mar 24, 1960                                        July 16, 2010 CHART #:  [id number]  [name] returns today after her surgery and we removed her chest tubes sutures.  Her incisions are healing well.  Her blood pressure was 126/84, pulse 60, and respirations 16.  Overall, she is doing well.  I released her and told she start driving and increase her activities and I will see her back again in 3 weeks with her chest x-ray.  Her x-ray today was markedly improved showing much less reaction.  Ines Bloomer, M.D. Electronically Signed  DPB/MEDQ  D:  07/16/2010  T:  07/17/2010  Job:  [id number]  cc:   Gretta Arab. Valentina Lucks, M.D.

## 2010-07-22 NOTE — Discharge Summary (Signed)
Kiara Kiara [name], Kiara [name] NO.:  [id number]  MEDICAL RECORD NO.:  [id number]  LOCATION:  2015                         FACILITY:  [facility]  PHYSICIAN:  Ines Bloomer, M.D. DATE OF BIRTH:  03-May-1960  DATE OF ADMISSION:  07/05/2010 DATE OF DISCHARGE:                              DISCHARGE SUMMARY   PRIMARY ADMITTING DIAGNOSIS:  Chronic left empyema.  ADDITIONAL/DISCHARGE DIAGNOSES: 1. Chronic left empyema. 2. Recent history of left lower lobe pneumonia. 3. Hyponatremia, resolved. 4. Remote history of tobacco use.  PROCEDURES PERFORMED:  Attempted left video-assisted thoracoscopic surgery, left thoracotomy with left lower lobe decortication, and drainage of empyema.  HISTORY:  The patient is a 50 year old female who recently visited [place] with her children on Spring Break when she developed left-sided chest pain with fevers and chills.  She was seen at [facility] and was admitted and treated for left lower lobe pneumonia.  She did undergo a thoracentesis while there.  She was discharged home on antibiotics and was seen once she returned to [facility] with a followup chest x-ray showing persistent left pleural effusion.  She underwent a CT scan on Jun 20, 2010 which showed a left lower lobe loculated empyema which appeared chronic with pleural thickening.  She was referred to Dr. Edwyna Shell for further evaluation.  It was felt that she would require decortication at this point.  All risks, benefits, and alternatives of surgery were explained to the patient and she agreed to proceed.  HOSPITAL COURSE:  The patient was admitted to [facility] on July 05, 2010 and was taken to the operating room where she underwent left thoracotomy for a left lower lobe decortication and drainage of empyema.  She tolerated the procedure well and was transferred to the SICU for postoperative convalescence.  Overall, her postoperative course has been stable and  uneventful.  Her chest tubes have been removed in the standard fashion.  Her last remaining chest tube will be removed on July 09, 2010 and a followup chest x-ray will be obtained.  Her postoperative chest x-rays have remained stable with improved aeration and a small loculated left basilar hydropneumothorax.  She has remained afebrile and vital signs have been stable postoperatively. Intraoperative cultures thus far have been negative and the patient has completed a course of IV antibiotics.  She previously had had hyponatremia prior to admission but her sodium has been stable postoperatively.  Overall, she is progressing well.  Her incisions are healing well.  She has been transferred to the floor and is continuing to progress.  She is tolerating a regular diet and is ambulating in the halls without difficulty.  She presently is maintaining O2 sats of greater than 90% on room air.  Her most recent labs show a sodium of 135, potassium 4.1, BUN 9, and creatinine 0.54.  Hemoglobin 10.1, hematocrit 29.8, white count 9.7, and platelets 198.  She will be reevaluated on the morning of July 10, 2010 and if she has remained stable and no other acute changes occur, we anticipate discharge home at that time.  DISCHARGE MEDICATIONS: 1. Tums 2 tablets daily with meals. 2. Nu-Iron 150 mg daily. 3. Ultram  50-100 mg q.6 h. p.r.n. for pain. 4. Claritin 10 mg daily p.r.n. 5. Desonide topical 0.05% daily p.r.n. 6. Finacea gel 15% daily p.r.n. 7. Ibuprofen 400 mg 1-2 q.8 h. P.r.n. 8. Loestrin 1 tablet daily. 9. Retin-A daily.  DISCHARGE INSTRUCTIONS:  She is asked to refrain from driving, heavy lifting, or strenuous activity.  She may continue ambulating daily and using her incentive spirometer.  She may shower daily and clean her incisions with soap and water.  She will continue her same preoperative diet.  DISCHARGE FOLLOWUP:  She will be scheduled to see Dr. Edwyna Shell in the office in 1 week with  a chest x-ray.  In the interim, if she experiences any problems or has questions, she is asked to contact our office immediately.     Coral Ceo, P.A.   ______________________________ Ines Bloomer, M.D.    GC/MEDQ  D:  07/09/2010  T:  07/10/2010  Job:  [id number]  cc:   Gretta Arab. Valentina Lucks, M.D. [facility] Office  Electronically Signed by Coral Ceo P.A. on 07/18/2010 12:58:23 PM Electronically Signed by Jovita Gamma M.D. on 07/22/2010 04:05:33 PM

## 2010-07-31 ENCOUNTER — Ambulatory Visit: Payer: BC Managed Care – PPO

## 2010-08-01 ENCOUNTER — Ambulatory Visit
Admission: RE | Admit: 2010-08-01 | Discharge: 2010-08-01 | Disposition: A | Discharge status: Home / Self Care | Payer: BC Managed Care – PPO | Source: Ambulatory Visit | Attending: Family Medicine | Admitting: Family Medicine

## 2010-08-01 DIAGNOSIS — Z1231 Encounter for screening mammogram for malignant neoplasm of breast: Secondary | ICD-10-CM

## 2010-08-05 ENCOUNTER — Other Ambulatory Visit: Payer: Self-pay | Admitting: Thoracic Surgery

## 2010-08-05 DIAGNOSIS — J869 Pyothorax without fistula: Secondary | ICD-10-CM

## 2010-08-06 ENCOUNTER — Ambulatory Visit (INDEPENDENT_AMBULATORY_CARE_PROVIDER_SITE_OTHER): Payer: Self-pay | Admitting: Thoracic Surgery

## 2010-08-06 ENCOUNTER — Ambulatory Visit
Admission: RE | Admit: 2010-08-06 | Discharge: 2010-08-06 | Disposition: A | Discharge status: Home / Self Care | Payer: BC Managed Care – PPO | Source: Ambulatory Visit | Attending: Thoracic Surgery | Admitting: Thoracic Surgery

## 2010-08-06 DIAGNOSIS — J869 Pyothorax without fistula: Secondary | ICD-10-CM

## 2010-08-07 NOTE — Assessment & Plan Note (Signed)
OFFICE VISIT  ALIRA, FRETWELL DOB:  1960-08-17                                        August 06, 2010 CHART #:  [id number]  The patient returns today and her incisions are well healed.  She unfortunately had allergic reaction to the Ultram, which was treated with prednisone and that has resolved.  She has had minimal pain.  I have released her to full activity.  Chest x-ray showed increased aeration and decreased in reaction in the left lower lobe and left costophrenic angle.  I will see her back again in 6 weeks with a chest x- ray for final or next to final check.  Ines Bloomer, M.D. Electronically Signed  DPB/MEDQ  D:  08/06/2010  T:  08/07/2010  Job:  [id number]

## 2010-09-18 ENCOUNTER — Ambulatory Visit: Payer: BC Managed Care – PPO | Admitting: Thoracic Surgery

## 2010-09-19 ENCOUNTER — Other Ambulatory Visit: Payer: Self-pay | Admitting: Thoracic Surgery

## 2010-09-19 DIAGNOSIS — J869 Pyothorax without fistula: Secondary | ICD-10-CM

## 2010-09-20 ENCOUNTER — Ambulatory Visit: Payer: Self-pay | Admitting: Thoracic Surgery

## 2010-09-25 ENCOUNTER — Encounter: Payer: Self-pay | Admitting: Thoracic Surgery

## 2010-09-25 ENCOUNTER — Ambulatory Visit (INDEPENDENT_AMBULATORY_CARE_PROVIDER_SITE_OTHER): Payer: Self-pay | Admitting: Thoracic Surgery

## 2010-09-25 ENCOUNTER — Ambulatory Visit
Admission: RE | Admit: 2010-09-25 | Discharge: 2010-09-25 | Disposition: A | Discharge status: Home / Self Care | Payer: BC Managed Care – PPO | Source: Ambulatory Visit | Attending: Thoracic Surgery | Admitting: Thoracic Surgery

## 2010-09-25 VITALS — BP 120/64 | HR 52 | Resp 16 | Ht 68.0 in | Wt 136.0 lb

## 2010-09-25 DIAGNOSIS — I8390 Asymptomatic varicose veins of unspecified lower extremity: Secondary | ICD-10-CM

## 2010-09-25 DIAGNOSIS — J869 Pyothorax without fistula: Secondary | ICD-10-CM

## 2010-09-25 DIAGNOSIS — Z8701 Personal history of pneumonia (recurrent): Secondary | ICD-10-CM

## 2010-09-25 DIAGNOSIS — E871 Hypo-osmolality and hyponatremia: Secondary | ICD-10-CM

## 2010-09-25 NOTE — Progress Notes (Signed)
HPI patient returns now 3 months after and resection of an chronic empyema cavity. Incisions are well healed she has some mild pain yawning. Chest x-ray showed normal postoperative changes she is doing well overall. I will see her  will see her back again and she has any future problems. Current Outpatient Prescriptions  Medication Sig Dispense Refill  . Azelaic Acid (FINACEA) 15 % cream Apply topically 2 (two) times daily. After skin is thoroughly washed and patted dry, gently but thoroughly massage a thin film of azelaic acid cream into the affected area twice daily, in the morning and evening.       . calcium carbonate (TUMS - DOSED IN MG ELEMENTAL CALCIUM) 500 MG chewable tablet Chew 1 tablet by mouth daily.        Marland Kitchen desonide (DESOWEN) 0.05 % cream Apply topically 2 (two) times daily.        Marland Kitchen ibuprofen (ADVIL,MOTRIN) 200 MG tablet Take 200 mg by mouth every 6 (six) hours as needed.        . loratadine (CLARITIN) 10 MG tablet Take 10 mg by mouth daily.        . norethindrone-ethinyl estradiol (JUNEL FE,GILDESS FE,LOESTRIN FE) 1-20 MG-MCG tablet Take 1 tablet by mouth daily.        Marland Kitchen tretinoin (RETIN-A) 0.05 % cream Apply topically at bedtime.        . iron polysaccharides (NIFEREX) 150 MG capsule Take 150 mg by mouth 1 day or 1 dose.        . traMADol (ULTRAM) 50 MG tablet Take 50 mg by mouth every 6 (six) hours as needed.           Review of Systems: No change  Physical Exam  Cardiovascular: Normal rate, regular rhythm and normal heart sounds.   Pulmonary/Chest: Effort normal and breath sounds normal. No respiratory distress.     Diagnostic Tests chest x-ray shows normal postoperative changes.:   Impression: Status post resection of empyema cavity.   Plan: Follow up as needed

## 2010-10-18 ENCOUNTER — Other Ambulatory Visit: Payer: Self-pay | Admitting: Dermatology

## 2011-06-23 ENCOUNTER — Other Ambulatory Visit: Payer: Self-pay | Admitting: Family Medicine

## 2011-06-23 DIAGNOSIS — Z1231 Encounter for screening mammogram for malignant neoplasm of breast: Secondary | ICD-10-CM

## 2011-08-08 ENCOUNTER — Ambulatory Visit
Admission: RE | Admit: 2011-08-08 | Discharge: 2011-08-08 | Disposition: A | Discharge status: Home / Self Care | Payer: BC Managed Care – PPO | Source: Ambulatory Visit | Attending: Family Medicine | Admitting: Family Medicine

## 2011-08-08 DIAGNOSIS — Z1231 Encounter for screening mammogram for malignant neoplasm of breast: Secondary | ICD-10-CM

## 2012-01-27 IMAGING — CR DG CHEST 2V
2 series · 2 of 2 positions shown · non-contrast
Comparison: Chest CT 06/20/2010 and radiographs 06/18/2010.

CLINICAL DATA: Recent pneumonia with empyema.  Preop respiratory
examination for VATS.

CHEST - 2 VIEW

[view not recorded (1 of 2)]
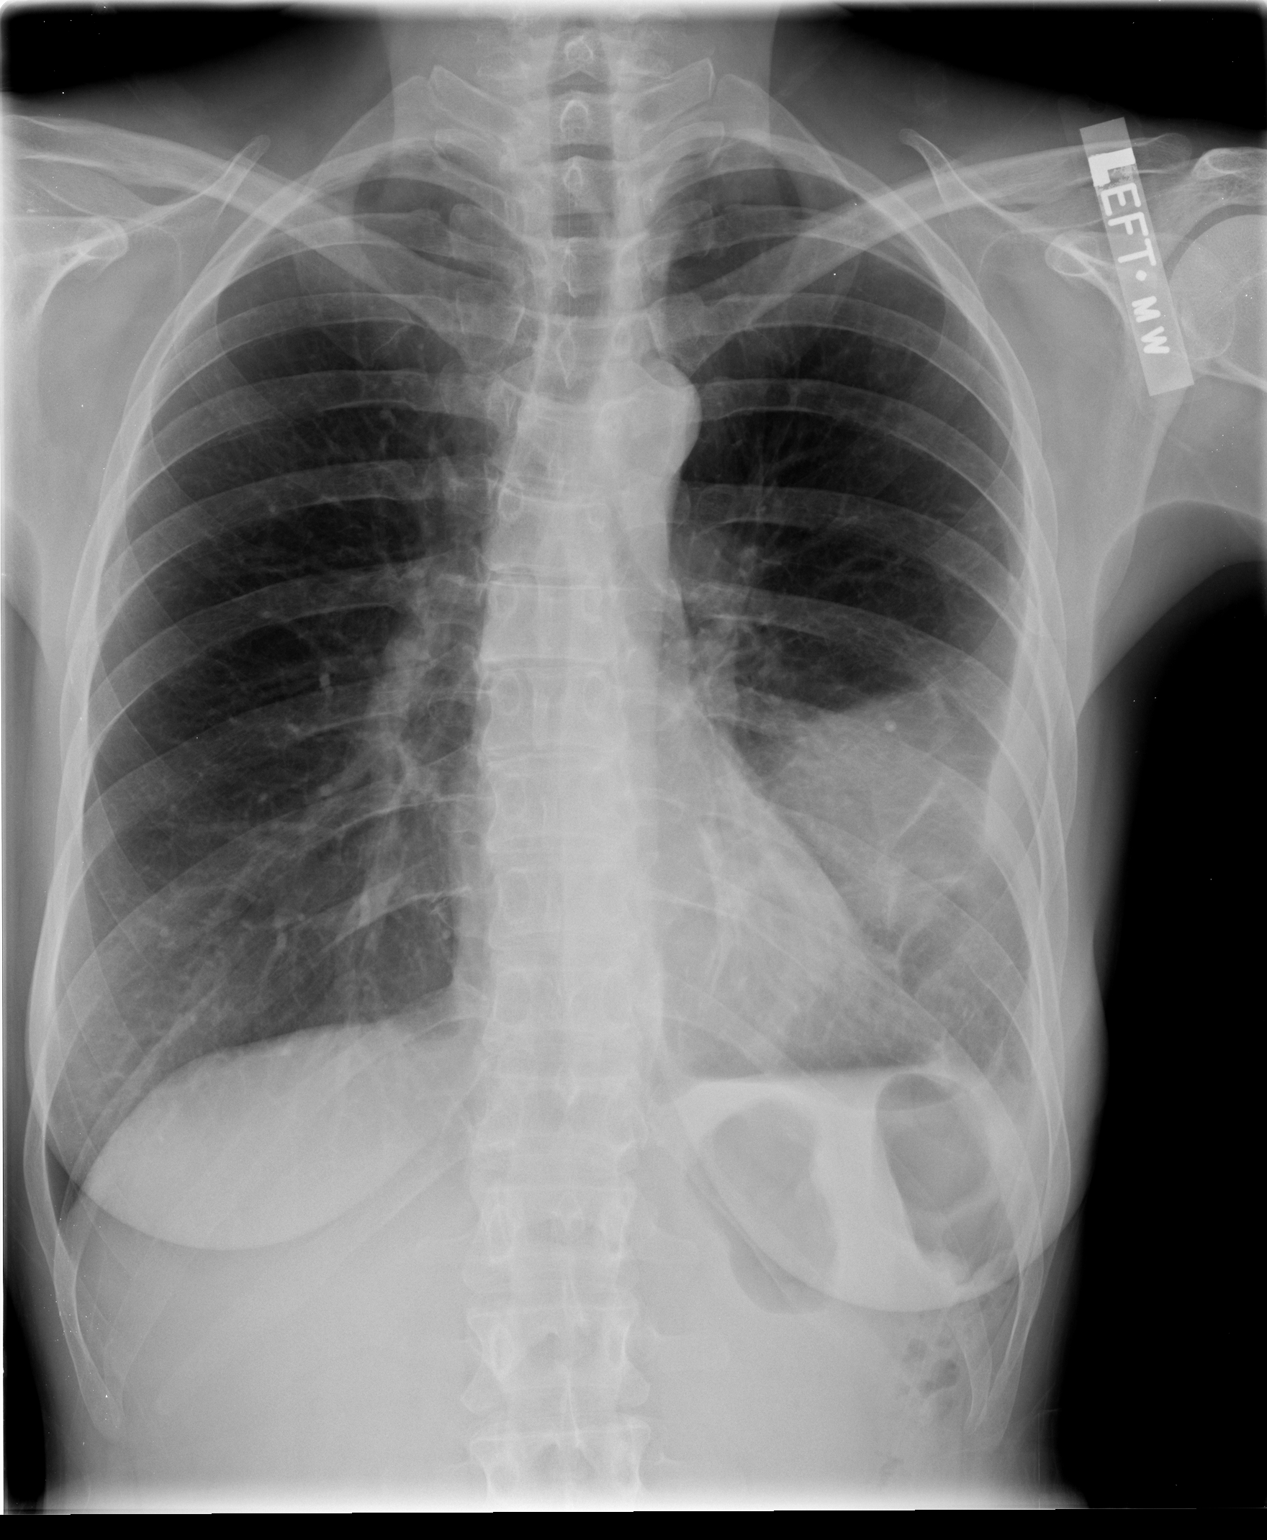

[view not recorded (2 of 2)]
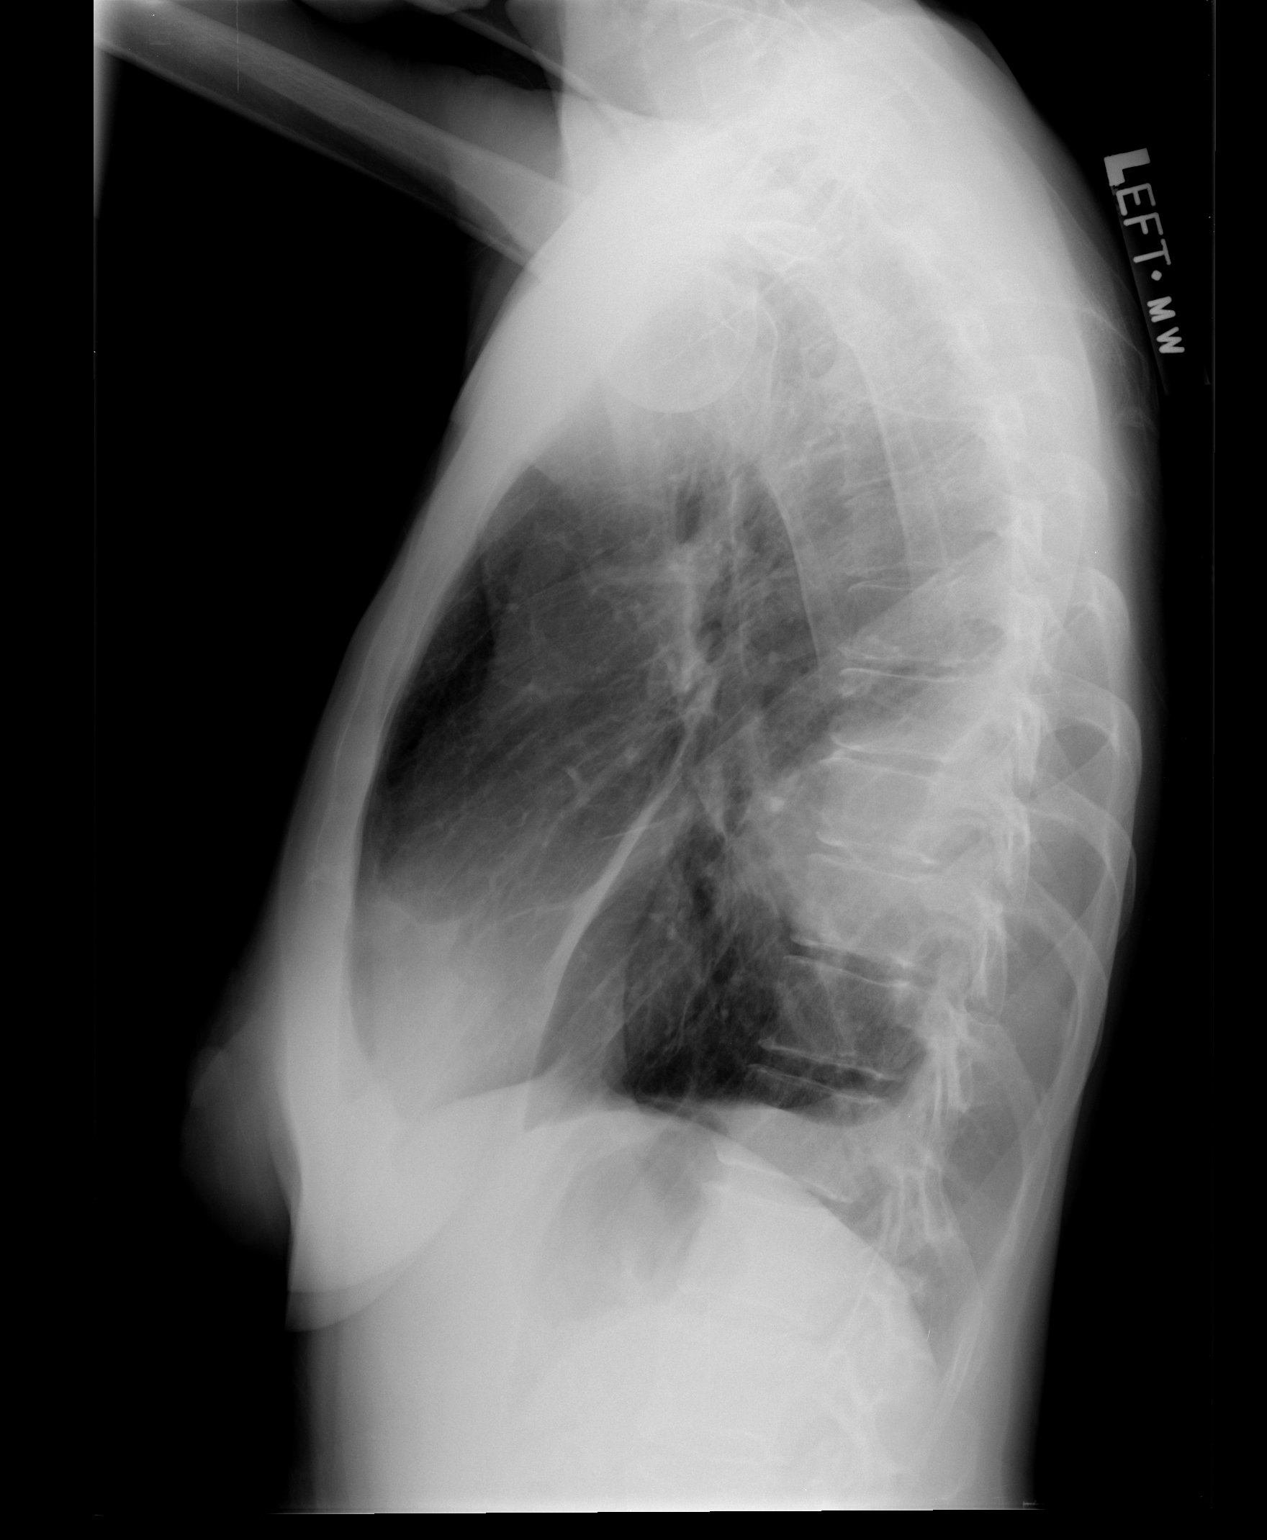

[2 of 2 positions shown; findings below may reference images not displayed]

FINDINGS: Loculated pleural effusion posterolaterally on the left
is unchanged.  There is no rib destruction.  The underlying opacity
in the left lower lobe appears stable and the right lung is clear.
The heart size and mediastinal contours are stable.
IMPRESSION: Stable loculated left pleural effusion consistent with empyema as
correlated with prior CT.  No new findings identified.

## 2012-01-31 IMAGING — CR DG CHEST 1V PORT
1 series · 1 of 1 positions shown · non-contrast
Comparison: 07/06/2010

CLINICAL DATA: Chest tube

PORTABLE CHEST - 1 VIEW

[view not recorded]
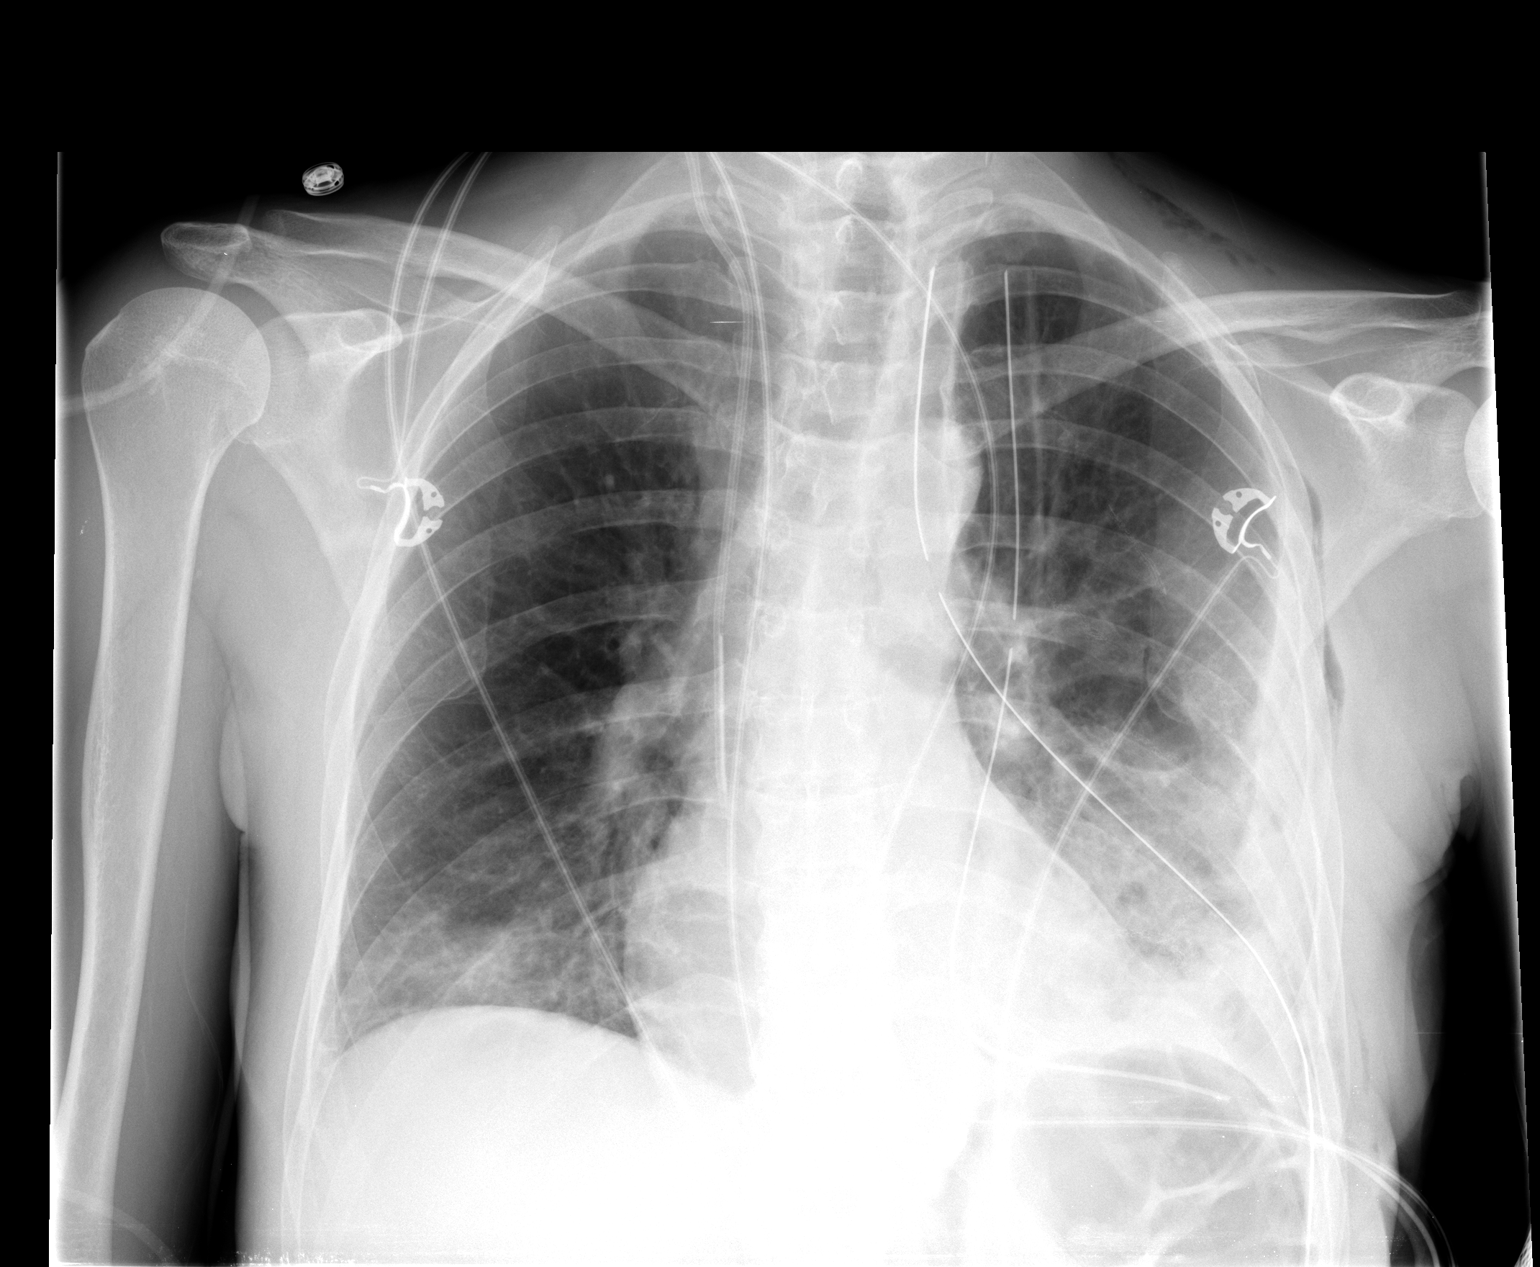

[1 of 1 positions shown; findings below may reference images not displayed]

FINDINGS: Two chest tubes on the left remain.  No pneumothorax.
Central venous catheter tip at the cavoatrial junction.

Increase in left lower lobe airspace disease.  Small area of
airspace disease in the right lung base also is more prominent.  No
heart failure.
IMPRESSION: Negative for pneumothorax.  Progression of bibasilar airspace
disease.

## 2012-02-02 IMAGING — CR DG CHEST 2V
2 series · 2 of 2 positions shown · non-contrast
Comparison: Portable chest x-ray of 07/08/2010

CLINICAL DATA: Left VATS [REDACTED], follow-up

CHEST - 2 VIEW

[w chest pa]
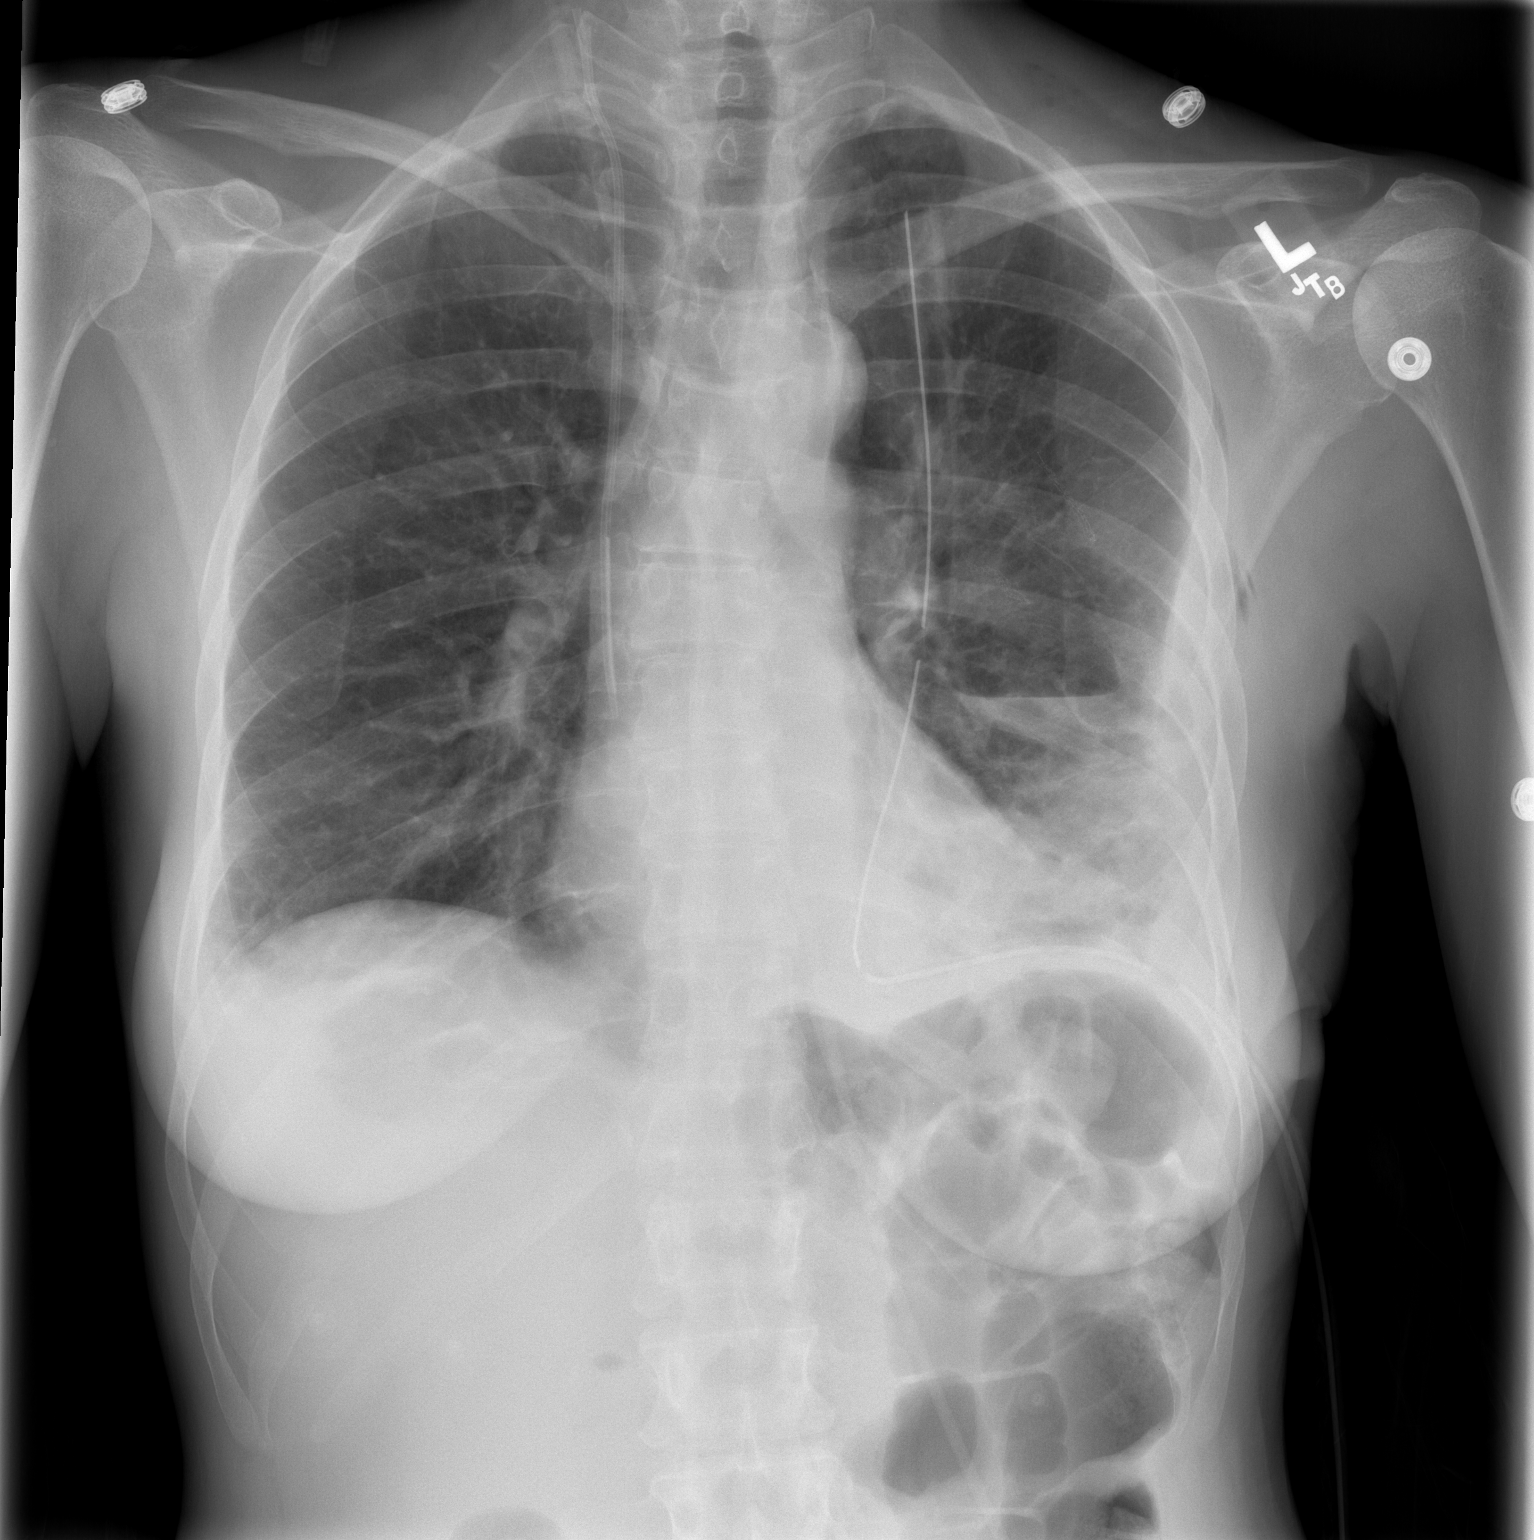

[w chest lat]
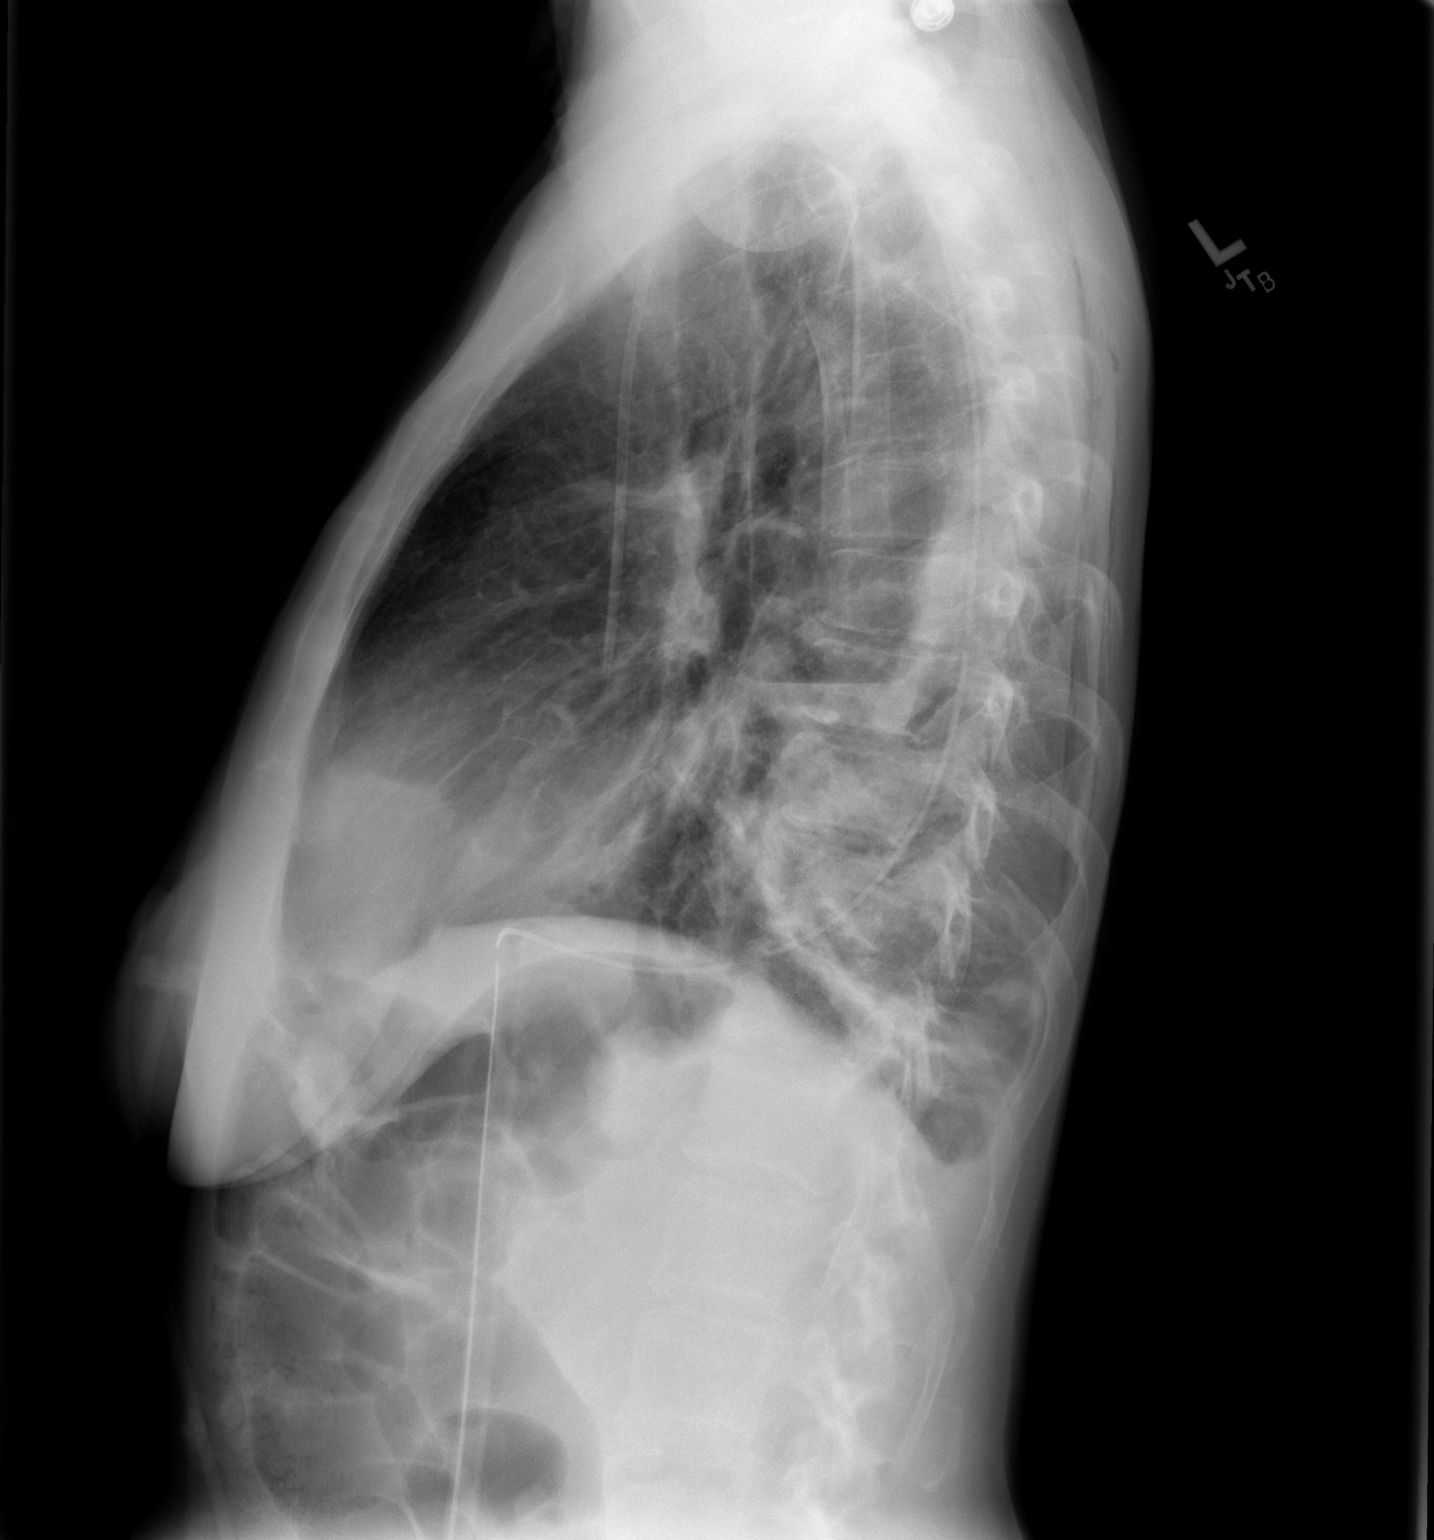

[2 of 2 positions shown; findings below may reference images not displayed]

FINDINGS: Aeration has improved slightly.  There is an air-fluid
level in the left lung base posteriorly most consistent with
loculated left hydropneumothorax.  A left chest tube remains.  The
right central venous line is unchanged.  Basilar atelectasis
persists.
IMPRESSION: 1.  Probable loculated posterior left basilar hydropneumothorax.
Left chest tube remains.
2.  Improved aeration.  Persistent basilar atelectasis left greater
than right.
3.  Left chest tube remains.

## 2012-04-20 ENCOUNTER — Other Ambulatory Visit: Payer: Self-pay | Admitting: Gastroenterology

## 2012-07-30 ENCOUNTER — Other Ambulatory Visit: Payer: Self-pay

## 2012-07-30 DIAGNOSIS — Z1231 Encounter for screening mammogram for malignant neoplasm of breast: Secondary | ICD-10-CM

## 2012-08-19 ENCOUNTER — Ambulatory Visit
Admission: RE | Admit: 2012-08-19 | Discharge: 2012-08-19 | Disposition: A | Discharge status: Home / Self Care | Payer: BC Managed Care – PPO | Source: Ambulatory Visit

## 2012-08-19 DIAGNOSIS — Z1231 Encounter for screening mammogram for malignant neoplasm of breast: Secondary | ICD-10-CM

## 2013-07-26 ENCOUNTER — Other Ambulatory Visit (HOSPITAL_COMMUNITY): Payer: Self-pay | Admitting: Obstetrics and Gynecology

## 2013-07-26 DIAGNOSIS — Z1231 Encounter for screening mammogram for malignant neoplasm of breast: Secondary | ICD-10-CM

## 2013-08-29 ENCOUNTER — Ambulatory Visit (HOSPITAL_COMMUNITY)
Admission: RE | Admit: 2013-08-29 | Discharge: 2013-08-29 | Disposition: A | Discharge status: Home / Self Care | Payer: BC Managed Care – PPO | Source: Ambulatory Visit | Attending: Obstetrics and Gynecology | Admitting: Obstetrics and Gynecology

## 2013-08-29 DIAGNOSIS — Z1231 Encounter for screening mammogram for malignant neoplasm of breast: Secondary | ICD-10-CM

## 2014-08-07 ENCOUNTER — Other Ambulatory Visit: Payer: Self-pay

## 2014-08-07 DIAGNOSIS — Z1231 Encounter for screening mammogram for malignant neoplasm of breast: Secondary | ICD-10-CM

## 2014-09-13 ENCOUNTER — Ambulatory Visit: Payer: Self-pay

## 2014-10-18 ENCOUNTER — Ambulatory Visit
Admission: RE | Admit: 2014-10-18 | Discharge: 2014-10-18 | Disposition: A | Discharge status: Home / Self Care | Payer: BLUE CROSS/BLUE SHIELD | Source: Ambulatory Visit

## 2014-10-18 DIAGNOSIS — Z1231 Encounter for screening mammogram for malignant neoplasm of breast: Secondary | ICD-10-CM

## 2015-09-21 ENCOUNTER — Other Ambulatory Visit: Payer: Self-pay | Admitting: Obstetrics and Gynecology

## 2015-09-21 DIAGNOSIS — Z1231 Encounter for screening mammogram for malignant neoplasm of breast: Secondary | ICD-10-CM

## 2015-10-05 DIAGNOSIS — Z1151 Encounter for screening for human papillomavirus (HPV): Secondary | ICD-10-CM | POA: Diagnosis not present

## 2015-10-05 DIAGNOSIS — Z6821 Body mass index (BMI) 21.0-21.9, adult: Secondary | ICD-10-CM | POA: Diagnosis not present

## 2015-10-05 DIAGNOSIS — Z01419 Encounter for gynecological examination (general) (routine) without abnormal findings: Secondary | ICD-10-CM | POA: Diagnosis not present

## 2015-10-19 ENCOUNTER — Ambulatory Visit
Admission: RE | Admit: 2015-10-19 | Discharge: 2015-10-19 | Disposition: A | Discharge status: Home / Self Care | Payer: BLUE CROSS/BLUE SHIELD | Source: Ambulatory Visit | Attending: Obstetrics and Gynecology | Admitting: Obstetrics and Gynecology

## 2015-10-19 DIAGNOSIS — Z1231 Encounter for screening mammogram for malignant neoplasm of breast: Secondary | ICD-10-CM | POA: Diagnosis not present

## 2015-11-04 NOTE — Progress Notes (Signed)
[facility] [address], [place] [address] Phone: [phone] Subjective:     CC: Left shoulder pain  [id number]  Kiara [name] is a 55 y.o. female coming in with complaint of left shoulder pain. Been going on a months. Does not remember any true injury. Seems to be slowly worsening. Discuss the pain as a dull, throbbing aching sensation that seems localized to the shoulder. No numbness or weakness. States that he can wake her up at night. Rates the severity of pain a 5 out of 10. States that certain movements and give her a sharp pain. Once again no weakness. Does respond to anti-inflammatories     Past Medical History:  Diagnosis Date  . Empyema lung ([place])    left  . Hx: recurrent pneumonia   . Hyponatremia   . Varicose vein of leg    Past Surgical History:  Procedure Laterality Date  . Attempte LT VATS Lt thoracotomy with LLL decortication ande drainage of empyema  07/06/10   Dr Arlyce Dice  . VARICOSE VEIN SURGERY     Dr Donnetta Hutching   Social History   Social History  . Marital status: Married    Spouse name: N/A  . Number of children: 3  . Years of education: N/A   Occupational History  .  Unemployed   Social History Main Topics  . Smoking status: Former Smoker    Types: Cigarettes    Quit date: 01/20/1990  . Smokeless tobacco: Never Used  . Alcohol use Yes     Comment: occasional  . Drug use: Unknown  . Sexual activity: Not Asked   Other Topics Concern  . None   Social History Narrative  . None   Allergies  Allergen Reactions  . Latex   . Ultram [Tramadol Hcl]    No family history on file. No family history of rheumatological diseases  Past medical history, social, surgical and family history all reviewed in electronic medical record.  No pertanent information unless stated regarding to the chief complaint.   Review of Systems: No headache, visual changes, nausea, vomiting, diarrhea, constipation, dizziness, abdominal  pain, skin rash, fevers, chills, night sweats, weight loss, swollen lymph nodes, body aches, joint swelling, muscle aches, chest pain, shortness of breath, mood changes.   Objective  Blood pressure 112/78, pulse (!) 52, weight 140 lb (63.5 kg), last menstrual period 06/24/2010, SpO2 99 %.  General: No apparent distress alert and oriented x3 mood and affect normal, dressed appropriately.  HEENT: Pupils equal, extraocular movements intact  Respiratory: Patient's speak in full sentences and does not appear short of breath  Cardiovascular: No lower extremity edema, non tender, no erythema  Skin: Warm dry intact with no signs of infection or rash on extremities or on axial skeleton.  Abdomen: Soft nontender  Neuro: Cranial nerves II through XII are intact, neurovascularly intact in all extremities with 2+ DTRs and 2+ pulses.  Lymph: No lymphadenopathy of posterior or anterior cervical chain or axillae bilaterally.  Gait normal with good balance and coordination.  MSK:  Non tender with full range of motion and good stability and symmetric strength and tone of elbows, wrist, hip, knee and ankles bilaterally.  Shoulder: left Inspection reveals no abnormalities, atrophy or asymmetry. Palpation is normal with no tenderness over AC joint or bicipital groove. ROM is full in all planes passively. Rotator cuff strength normal throughout. signs of impingement with positive Neer and Hawkin's tests, but negative empty can sign. Speeds  and Yergason's tests normal. No labral pathology noted with negative Obrien's, negative clunk and good stability. Normal scapular function observed. No painful arc and no drop arm sign. No apprehension sign  MSK US performed of: left This study was ordered, performed, and interpreted by Charlann Boxer D.O.  Shoulder:   Supraspinatus:  Appears normal on long and transverse views, Bursal bulge seen with shoulder abduction on impingement view. Infraspinatus:  Appears normal on  long and transverse views. Significant increase in Doppler flow Subscapularis:  Appears normal on long and transverse views. Positive bursa Teres Minor:  Appears normal on long and transverse views. AC joint:  Capsule undistended, no geyser sign. Glenohumeral Joint:  Appears normal without effusion. Glenoid Labrum:  Intact without visualized tears. Biceps Tendon:  Appears normal on long and transverse views, no fraying of tendon, tendon located in intertubercular groove, no subluxation with shoulder internal or external rotation.  Impression: Subacromial bursitis   procedure note [id number]; 15 minutes spent for Therapeutic exercises as stated in above notes.  This included exercises focusing on stretching, strengthening, with significant focus on eccentric aspects. [id number]; 15 minutes spent for Therapeutic exercises as stated in above notes.  This included exercises focusing on stretching, strengthening, with significant focus on eccentric aspects.   Proper technique shown and discussed handout in great detail with ATC.  All questions were discussed and answered.    Proper technique shown and discussed handout in great detail with ATC.  All questions were discussed and answered.   Impression and Recommendations:     This case required medical decision making of moderate complexity.      Note: This dictation was prepared with Dragon dictation along with smaller phrase technology. Any transcriptional errors that result from this process are unintentional.

## 2015-11-05 ENCOUNTER — Encounter: Payer: Self-pay | Admitting: Family Medicine

## 2015-11-05 ENCOUNTER — Ambulatory Visit (INDEPENDENT_AMBULATORY_CARE_PROVIDER_SITE_OTHER): Payer: BLUE CROSS/BLUE SHIELD | Admitting: Family Medicine

## 2015-11-05 ENCOUNTER — Ambulatory Visit: Payer: BLUE CROSS/BLUE SHIELD | Admitting: Family Medicine

## 2015-11-05 ENCOUNTER — Other Ambulatory Visit: Payer: Self-pay

## 2015-11-05 VITALS — BP 112/78 | HR 52 | Wt 140.0 lb

## 2015-11-05 DIAGNOSIS — G8929 Other chronic pain: Secondary | ICD-10-CM

## 2015-11-05 DIAGNOSIS — M25512 Pain in left shoulder: Secondary | ICD-10-CM | POA: Diagnosis not present

## 2015-11-05 DIAGNOSIS — M7552 Bursitis of left shoulder: Secondary | ICD-10-CM

## 2015-11-05 MED ORDER — DICLOFENAC SODIUM 2 % TD SOLN: TRANSDERMAL | 3 refills | Status: AC

## 2015-11-05 NOTE — Assessment & Plan Note (Signed)
Patient does have more of a bursitis of left shoulder. We discussed icing regimen, home exercises, which activities to do an which was potentially avoid. Patient will continue to be active. Work with Product/process development scientist today. Patient given prescription for topical anti-inflammatories. Discussed over-the-counter medications. Follow-up in 4 weeks. Forcing symptoms consider injection and formal physical therapy.

## 2015-11-05 NOTE — Patient Instructions (Signed)
Good to see you.  Ice 20 minutes 2 times daily. Usually after activity and before bed. Exercises 3 times a week.  pennsaid pinkie amount topically 2 times daily as needed.  Keep hands within peripheral vision.  Vitamin D 2000 IU dialy  Turmeric 500mg  2 times daily  OK to do yoga See me again in 4 weeks

## 2015-11-08 ENCOUNTER — Other Ambulatory Visit: Payer: BLUE CROSS/BLUE SHIELD

## 2015-11-08 DIAGNOSIS — M25512 Pain in left shoulder: Principal | ICD-10-CM

## 2015-11-08 DIAGNOSIS — G8929 Other chronic pain: Secondary | ICD-10-CM

## 2015-12-03 ENCOUNTER — Ambulatory Visit: Payer: BLUE CROSS/BLUE SHIELD | Admitting: Family Medicine

## 2015-12-18 NOTE — Progress Notes (Signed)
[facility] [place], [place] [address] Phone: [phone] Subjective:     CC: Left shoulder pain f/u  [id number]  Kiara [name] is a 55 y.o. female coming in with complaint of left shoulder pain. Patient sent have more of subacromial bursitis at last follow-up. This is 6 weeks ago. Patient elected try conservative therapy including home exercises, icing protocol, and topical anti-inflammatories. Patient states minimal change at this time. Patient continues to have more of a discomfort overall. Patient states that more of the repetitive activity where she is lifting and putting close on hangers seem to be more aggravating didn't yoga or exercises themselves. Has been doing the exercises adamantly. Has not been using the topical anti-inflammatories. Worsening pain but no significant improvement.      Past Medical History:  Diagnosis Date  . 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 Hx: recurrent pneumonia   . Hyponatremia   . Varicose vein of leg    Past Surgical History:  Procedure Laterality Date  . Attempte LT VATS Lt thoracotomy with LLL decortication ande drainage of empyema  07/06/10   Dr Arlyce Dice  . VARICOSE VEIN SURGERY     Dr Donnetta Hutching   Social History   Social History  . Marital status: Married    Spouse name: N/A  . Number of children: 3  . Years of education: N/A   Occupational History  .  Unemployed   Social History Main Topics  . Smoking status: Former Smoker    Types: Cigarettes    Quit date: 01/20/1990  . Smokeless tobacco: Never Used  . Alcohol use Yes     Comment: occasional  . Drug use: Unknown  . Sexual activity: Not Asked   Other Topics Concern  . None   Social History Narrative  . None   Allergies  Allergen Reactions  . Latex   . Ultram [Tramadol Hcl]    No family history on file. No family history of rheumatological diseases  Past medical history, social, surgical and family history all reviewed in  electronic medical record.  No pertanent information unless stated regarding to the chief complaint.   Review of Systems: No headache, visual changes, nausea, vomiting, diarrhea, constipation, dizziness, abdominal pain, skin rash, fevers, chills, night sweats, weight loss, swollen lymph nodes, body aches, joint swelling, muscle aches, chest pain, shortness of breath, mood changes.   Objective  Blood pressure 110/76, height 5\' 8"  (1.727 m), weight 139 lb (63 kg), last menstrual period 06/24/2010.  Systems examined below as of 12/19/15 General: NAD A&O x3 mood, affect normal  HEENT: Pupils equal, extraocular movements intact no nystagmus Respiratory: not short of breath at rest or with speaking Cardiovascular: No lower extremity edema, non tender Skin: Warm dry intact with no signs of infection or rash on extremities or on axial skeleton. Abdomen: Soft nontender, no masses Neuro: Cranial nerves  intact, neurovascularly intact in all extremities with 2+ DTRs and 2+ pulses. Lymph: No lymphadenopathy appreciated today  Gait normal with good balance and coordination.  MSK: Non tender with full range of motion and good stability and symmetric strength and tone of elbows, wrist,  knee hips and ankles bilaterally.    Shoulder: left Inspection reveals no abnormalities, atrophy or asymmetry. Palpation is normal with no tenderness over AC joint or bicipital groove. ROM is full in all planes passively. Rotator cuff strength normal throughout. Continued impingement signs of seem to be worsening. Speeds and Yergason's tests  normal. Mild positive labral pathology Normal scapular function observed. No painful arc and no drop arm sign. No apprehension sign  Procedure: Real-time Ultrasound Guided Injection of left glenohumeral joint Device: GE Logiq E  Ultrasound guided injection is preferred based studies that show increased duration, increased effect, greater accuracy, decreased procedural pain,  increased response rate with ultrasound guided versus blind injection.  Verbal informed consent obtained.  Time-out conducted.  Noted no overlying erythema, induration, or other signs of local infection.  Skin prepped in a sterile fashion.  Local anesthesia: Topical Ethyl chloride.  With sterile technique and under real time ultrasound guidance:  Joint visualized.  23g 1  inch needle inserted posterior approach. Pictures taken for needle placement. Patient did have injection of 2 cc of 1% lidocaine, 2 cc of 0.5% Marcaine, and 1cc of Kenalog 40 mg/dL. Completed without difficulty  Pain immediately resolved suggesting accurate placement of the medication.  Advised to call if fevers/chills, erythema, induration, drainage, or persistent bleeding.  Images permanently stored and available for review in the ultrasound unit.  Impression: Technically successful ultrasound guided injection.       Impression and Recommendations:     This case required medical decision making of moderate complexity.      Note: This dictation was prepared with Dragon dictation along with smaller phrase technology. Any transcriptional errors that result from this process are unintentional.

## 2015-12-19 ENCOUNTER — Ambulatory Visit (INDEPENDENT_AMBULATORY_CARE_PROVIDER_SITE_OTHER): Payer: BLUE CROSS/BLUE SHIELD | Admitting: Family Medicine

## 2015-12-19 ENCOUNTER — Encounter: Payer: Self-pay | Admitting: Family Medicine

## 2015-12-19 ENCOUNTER — Ambulatory Visit: Payer: Self-pay

## 2015-12-19 VITALS — BP 110/76 | Ht 68.0 in | Wt 139.0 lb

## 2015-12-19 DIAGNOSIS — M7552 Bursitis of left shoulder: Secondary | ICD-10-CM

## 2015-12-19 NOTE — Patient Instructions (Addendum)
Good to see you  Happy holidays!  Ice is your friend.  Take today and tomorrow easier.  Continue what you are doing  Keep hands within peripheral vision with lifting.  See me again in 4 week sand you should be doing well.  Happy holidays!

## 2015-12-19 NOTE — Assessment & Plan Note (Signed)
Patient given injection today and tolerated the procedure well. We discussed icing regimen and home exercises. We discussed icing regimen. Patient will continue with this. Decline formal physical therapy. Follow-up again in 4 weeks.

## 2016-01-16 ENCOUNTER — Ambulatory Visit: Payer: BLUE CROSS/BLUE SHIELD | Admitting: Family Medicine

## 2016-09-11 ENCOUNTER — Other Ambulatory Visit: Payer: Self-pay | Admitting: Obstetrics and Gynecology

## 2016-09-11 DIAGNOSIS — Z1231 Encounter for screening mammogram for malignant neoplasm of breast: Secondary | ICD-10-CM

## 2016-10-20 ENCOUNTER — Ambulatory Visit
Admission: RE | Admit: 2016-10-20 | Discharge: 2016-10-20 | Disposition: A | Discharge status: Home / Self Care | Payer: BLUE CROSS/BLUE SHIELD | Source: Ambulatory Visit | Attending: Obstetrics and Gynecology | Admitting: Obstetrics and Gynecology

## 2016-10-20 DIAGNOSIS — Z1231 Encounter for screening mammogram for malignant neoplasm of breast: Secondary | ICD-10-CM | POA: Diagnosis not present

## 2016-11-14 DIAGNOSIS — R87610 Atypical squamous cells of undetermined significance on cytologic smear of cervix (ASC-US): Secondary | ICD-10-CM | POA: Diagnosis not present

## 2016-11-14 DIAGNOSIS — Z23 Encounter for immunization: Secondary | ICD-10-CM | POA: Diagnosis not present

## 2016-11-14 DIAGNOSIS — Z01419 Encounter for gynecological examination (general) (routine) without abnormal findings: Secondary | ICD-10-CM | POA: Diagnosis not present

## 2016-11-14 DIAGNOSIS — Z6821 Body mass index (BMI) 21.0-21.9, adult: Secondary | ICD-10-CM | POA: Diagnosis not present

## 2016-11-14 DIAGNOSIS — Z1151 Encounter for screening for human papillomavirus (HPV): Secondary | ICD-10-CM | POA: Diagnosis not present

## 2017-01-30 DIAGNOSIS — Z131 Encounter for screening for diabetes mellitus: Secondary | ICD-10-CM | POA: Diagnosis not present

## 2017-01-30 DIAGNOSIS — Z1329 Encounter for screening for other suspected endocrine disorder: Secondary | ICD-10-CM | POA: Diagnosis not present

## 2017-01-30 DIAGNOSIS — Z13220 Encounter for screening for lipoid disorders: Secondary | ICD-10-CM | POA: Diagnosis not present

## 2017-01-30 DIAGNOSIS — Z130 Encounter for screening for diseases of the blood and blood-forming organs and certain disorders involving the immune mechanism: Secondary | ICD-10-CM | POA: Diagnosis not present

## 2017-01-30 DIAGNOSIS — Z0000 Encounter for general adult medical examination without abnormal findings: Secondary | ICD-10-CM | POA: Diagnosis not present

## 2017-06-24 DIAGNOSIS — Z86010 Personal history of colonic polyps: Secondary | ICD-10-CM | POA: Diagnosis not present

## 2017-07-27 DIAGNOSIS — M7671 Peroneal tendinitis, right leg: Secondary | ICD-10-CM | POA: Diagnosis not present

## 2017-07-27 DIAGNOSIS — M79671 Pain in right foot: Secondary | ICD-10-CM | POA: Diagnosis not present

## 2017-07-31 ENCOUNTER — Encounter

## 2017-07-31 ENCOUNTER — Ambulatory Visit: Payer: BLUE CROSS/BLUE SHIELD | Admitting: Family Medicine

## 2017-08-17 DIAGNOSIS — M7671 Peroneal tendinitis, right leg: Secondary | ICD-10-CM | POA: Diagnosis not present

## 2017-08-25 DIAGNOSIS — M7671 Peroneal tendinitis, right leg: Secondary | ICD-10-CM | POA: Diagnosis not present

## 2017-09-09 DIAGNOSIS — M7671 Peroneal tendinitis, right leg: Secondary | ICD-10-CM | POA: Diagnosis not present

## 2017-09-30 ENCOUNTER — Other Ambulatory Visit: Payer: Self-pay | Admitting: Obstetrics and Gynecology

## 2017-09-30 DIAGNOSIS — Z1231 Encounter for screening mammogram for malignant neoplasm of breast: Secondary | ICD-10-CM

## 2017-10-28 ENCOUNTER — Ambulatory Visit
Admission: RE | Admit: 2017-10-28 | Discharge: 2017-10-28 | Disposition: A | Discharge status: Home / Self Care | Payer: BLUE CROSS/BLUE SHIELD | Source: Ambulatory Visit | Attending: Obstetrics and Gynecology | Admitting: Obstetrics and Gynecology

## 2017-10-28 DIAGNOSIS — Z1231 Encounter for screening mammogram for malignant neoplasm of breast: Secondary | ICD-10-CM

## 2018-04-19 DIAGNOSIS — Z1151 Encounter for screening for human papillomavirus (HPV): Secondary | ICD-10-CM | POA: Diagnosis not present

## 2018-04-19 DIAGNOSIS — Z118 Encounter for screening for other infectious and parasitic diseases: Secondary | ICD-10-CM | POA: Diagnosis not present

## 2018-04-19 DIAGNOSIS — Z6822 Body mass index (BMI) 22.0-22.9, adult: Secondary | ICD-10-CM | POA: Diagnosis not present

## 2018-04-19 DIAGNOSIS — Z01419 Encounter for gynecological examination (general) (routine) without abnormal findings: Secondary | ICD-10-CM | POA: Diagnosis not present

## 2018-04-19 DIAGNOSIS — Z124 Encounter for screening for malignant neoplasm of cervix: Secondary | ICD-10-CM | POA: Diagnosis not present

## 2018-10-08 ENCOUNTER — Other Ambulatory Visit: Payer: Self-pay | Admitting: Obstetrics and Gynecology

## 2018-10-08 DIAGNOSIS — Z1231 Encounter for screening mammogram for malignant neoplasm of breast: Secondary | ICD-10-CM

## 2018-10-27 DIAGNOSIS — H5213 Myopia, bilateral: Secondary | ICD-10-CM | POA: Diagnosis not present

## 2018-10-27 DIAGNOSIS — H524 Presbyopia: Secondary | ICD-10-CM | POA: Diagnosis not present

## 2018-11-24 ENCOUNTER — Other Ambulatory Visit: Payer: Self-pay

## 2018-11-24 ENCOUNTER — Ambulatory Visit
Admission: RE | Admit: 2018-11-24 | Discharge: 2018-11-24 | Disposition: A | Discharge status: Home / Self Care | Payer: BLUE CROSS/BLUE SHIELD | Source: Ambulatory Visit | Attending: Obstetrics and Gynecology | Admitting: Obstetrics and Gynecology

## 2018-11-24 DIAGNOSIS — Z1231 Encounter for screening mammogram for malignant neoplasm of breast: Secondary | ICD-10-CM

## 2019-07-04 DIAGNOSIS — Z01419 Encounter for gynecological examination (general) (routine) without abnormal findings: Secondary | ICD-10-CM | POA: Diagnosis not present

## 2019-07-04 DIAGNOSIS — Z6822 Body mass index (BMI) 22.0-22.9, adult: Secondary | ICD-10-CM | POA: Diagnosis not present

## 2019-07-04 DIAGNOSIS — Z1151 Encounter for screening for human papillomavirus (HPV): Secondary | ICD-10-CM | POA: Diagnosis not present

## 2019-07-12 ENCOUNTER — Other Ambulatory Visit: Payer: Self-pay | Admitting: Obstetrics and Gynecology

## 2019-07-12 DIAGNOSIS — E2839 Other primary ovarian failure: Secondary | ICD-10-CM

## 2019-07-12 DIAGNOSIS — Z780 Asymptomatic menopausal state: Secondary | ICD-10-CM

## 2019-07-29 ENCOUNTER — Other Ambulatory Visit: Payer: Self-pay | Admitting: Obstetrics and Gynecology

## 2019-07-29 DIAGNOSIS — Z1231 Encounter for screening mammogram for malignant neoplasm of breast: Secondary | ICD-10-CM

## 2019-10-18 DIAGNOSIS — D2262 Melanocytic nevi of left upper limb, including shoulder: Secondary | ICD-10-CM | POA: Diagnosis not present

## 2019-10-18 DIAGNOSIS — L300 Nummular dermatitis: Secondary | ICD-10-CM | POA: Diagnosis not present

## 2019-10-18 DIAGNOSIS — D225 Melanocytic nevi of trunk: Secondary | ICD-10-CM | POA: Diagnosis not present

## 2019-10-18 DIAGNOSIS — D2261 Melanocytic nevi of right upper limb, including shoulder: Secondary | ICD-10-CM | POA: Diagnosis not present

## 2019-11-23 DIAGNOSIS — Z419 Encounter for procedure for purposes other than remedying health state, unspecified: Secondary | ICD-10-CM | POA: Diagnosis not present

## 2019-11-25 ENCOUNTER — Other Ambulatory Visit: Payer: Self-pay

## 2019-11-25 ENCOUNTER — Ambulatory Visit
Admission: RE | Admit: 2019-11-25 | Discharge: 2019-11-25 | Disposition: A | Discharge status: Home / Self Care | Payer: BC Managed Care – PPO | Source: Ambulatory Visit | Attending: Obstetrics and Gynecology | Admitting: Obstetrics and Gynecology

## 2019-11-25 DIAGNOSIS — Z780 Asymptomatic menopausal state: Secondary | ICD-10-CM

## 2019-11-25 DIAGNOSIS — M8589 Other specified disorders of bone density and structure, multiple sites: Secondary | ICD-10-CM | POA: Diagnosis not present

## 2019-11-25 DIAGNOSIS — E2839 Other primary ovarian failure: Secondary | ICD-10-CM

## 2019-11-25 DIAGNOSIS — Z1231 Encounter for screening mammogram for malignant neoplasm of breast: Secondary | ICD-10-CM

## 2020-01-25 DIAGNOSIS — Z20822 Contact with and (suspected) exposure to covid-19: Secondary | ICD-10-CM | POA: Diagnosis not present

## 2020-01-28 DIAGNOSIS — Z1152 Encounter for screening for COVID-19: Secondary | ICD-10-CM | POA: Diagnosis not present

## 2020-01-28 DIAGNOSIS — Z20822 Contact with and (suspected) exposure to covid-19: Secondary | ICD-10-CM | POA: Diagnosis not present

## 2020-02-20 DIAGNOSIS — L820 Inflamed seborrheic keratosis: Secondary | ICD-10-CM | POA: Diagnosis not present

## 2020-05-31 DIAGNOSIS — J069 Acute upper respiratory infection, unspecified: Secondary | ICD-10-CM | POA: Diagnosis not present

## 2020-05-31 DIAGNOSIS — J029 Acute pharyngitis, unspecified: Secondary | ICD-10-CM | POA: Diagnosis not present

## 2020-05-31 DIAGNOSIS — U071 COVID-19: Secondary | ICD-10-CM | POA: Diagnosis not present

## 2020-08-14 DIAGNOSIS — Z01419 Encounter for gynecological examination (general) (routine) without abnormal findings: Secondary | ICD-10-CM | POA: Diagnosis not present

## 2020-08-14 DIAGNOSIS — N952 Postmenopausal atrophic vaginitis: Secondary | ICD-10-CM | POA: Diagnosis not present

## 2020-10-30 ENCOUNTER — Other Ambulatory Visit: Payer: Self-pay | Admitting: Obstetrics and Gynecology

## 2020-10-30 DIAGNOSIS — Z1231 Encounter for screening mammogram for malignant neoplasm of breast: Secondary | ICD-10-CM

## 2020-11-27 ENCOUNTER — Ambulatory Visit
Admission: RE | Admit: 2020-11-27 | Discharge: 2020-11-27 | Disposition: A | Discharge status: Home / Self Care | Payer: BC Managed Care – PPO | Source: Ambulatory Visit | Attending: Obstetrics and Gynecology | Admitting: Obstetrics and Gynecology

## 2020-11-27 ENCOUNTER — Other Ambulatory Visit: Payer: Self-pay

## 2020-11-27 DIAGNOSIS — Z1231 Encounter for screening mammogram for malignant neoplasm of breast: Secondary | ICD-10-CM

## 2021-03-01 DIAGNOSIS — H5203 Hypermetropia, bilateral: Secondary | ICD-10-CM | POA: Diagnosis not present

## 2021-08-15 DIAGNOSIS — N952 Postmenopausal atrophic vaginitis: Secondary | ICD-10-CM | POA: Diagnosis not present

## 2021-08-15 DIAGNOSIS — M8580 Other specified disorders of bone density and structure, unspecified site: Secondary | ICD-10-CM | POA: Diagnosis not present

## 2021-08-15 DIAGNOSIS — Z01419 Encounter for gynecological examination (general) (routine) without abnormal findings: Secondary | ICD-10-CM | POA: Diagnosis not present

## 2021-08-15 DIAGNOSIS — R87610 Atypical squamous cells of undetermined significance on cytologic smear of cervix (ASC-US): Secondary | ICD-10-CM | POA: Diagnosis not present

## 2021-08-16 ENCOUNTER — Other Ambulatory Visit: Payer: Self-pay | Admitting: Internal Medicine

## 2021-08-16 ENCOUNTER — Other Ambulatory Visit: Payer: Self-pay | Admitting: Obstetrics and Gynecology

## 2021-08-16 ENCOUNTER — Other Ambulatory Visit: Payer: Self-pay | Admitting: Diagnostic Radiology

## 2021-08-16 DIAGNOSIS — Z1231 Encounter for screening mammogram for malignant neoplasm of breast: Secondary | ICD-10-CM

## 2021-08-16 DIAGNOSIS — M8580 Other specified disorders of bone density and structure, unspecified site: Secondary | ICD-10-CM

## 2021-10-18 DIAGNOSIS — Z0000 Encounter for general adult medical examination without abnormal findings: Secondary | ICD-10-CM | POA: Diagnosis not present

## 2021-10-18 DIAGNOSIS — Z23 Encounter for immunization: Secondary | ICD-10-CM | POA: Diagnosis not present

## 2021-10-21 ENCOUNTER — Other Ambulatory Visit: Payer: Self-pay | Admitting: Internal Medicine

## 2021-10-21 DIAGNOSIS — Z8249 Family history of ischemic heart disease and other diseases of the circulatory system: Secondary | ICD-10-CM

## 2021-11-28 ENCOUNTER — Ambulatory Visit
Admission: RE | Admit: 2021-11-28 | Discharge: 2021-11-28 | Disposition: A | Discharge status: Home / Self Care | Payer: BC Managed Care – PPO | Source: Ambulatory Visit | Attending: Internal Medicine | Admitting: Internal Medicine

## 2021-11-28 DIAGNOSIS — Z1231 Encounter for screening mammogram for malignant neoplasm of breast: Secondary | ICD-10-CM

## 2021-12-06 DIAGNOSIS — M25571 Pain in right ankle and joints of right foot: Secondary | ICD-10-CM | POA: Diagnosis not present

## 2021-12-06 DIAGNOSIS — M19071 Primary osteoarthritis, right ankle and foot: Secondary | ICD-10-CM | POA: Diagnosis not present

## 2021-12-27 ENCOUNTER — Other Ambulatory Visit: Payer: BC Managed Care – PPO

## 2022-01-08 DIAGNOSIS — M19071 Primary osteoarthritis, right ankle and foot: Secondary | ICD-10-CM | POA: Diagnosis not present

## 2022-01-28 DIAGNOSIS — M25571 Pain in right ankle and joints of right foot: Secondary | ICD-10-CM | POA: Diagnosis not present

## 2022-01-31 ENCOUNTER — Ambulatory Visit
Admission: RE | Admit: 2022-01-31 | Discharge: 2022-01-31 | Disposition: A | Discharge status: Home / Self Care | Payer: No Typology Code available for payment source | Source: Ambulatory Visit | Attending: Internal Medicine

## 2022-01-31 DIAGNOSIS — Z8249 Family history of ischemic heart disease and other diseases of the circulatory system: Secondary | ICD-10-CM

## 2022-01-31 DIAGNOSIS — M25571 Pain in right ankle and joints of right foot: Secondary | ICD-10-CM | POA: Diagnosis not present

## 2022-01-31 DIAGNOSIS — I700 Atherosclerosis of aorta: Secondary | ICD-10-CM | POA: Diagnosis not present

## 2022-02-03 DIAGNOSIS — D485 Neoplasm of uncertain behavior of skin: Secondary | ICD-10-CM | POA: Diagnosis not present

## 2022-02-03 DIAGNOSIS — D225 Melanocytic nevi of trunk: Secondary | ICD-10-CM | POA: Diagnosis not present

## 2022-02-03 DIAGNOSIS — L905 Scar conditions and fibrosis of skin: Secondary | ICD-10-CM | POA: Diagnosis not present

## 2022-02-04 ENCOUNTER — Ambulatory Visit
Admission: RE | Admit: 2022-02-04 | Discharge: 2022-02-04 | Disposition: A | Discharge status: Home / Self Care | Payer: BC Managed Care – PPO | Source: Ambulatory Visit | Attending: Internal Medicine | Admitting: Internal Medicine

## 2022-02-04 DIAGNOSIS — M8589 Other specified disorders of bone density and structure, multiple sites: Secondary | ICD-10-CM | POA: Diagnosis not present

## 2022-02-04 DIAGNOSIS — Z780 Asymptomatic menopausal state: Secondary | ICD-10-CM | POA: Diagnosis not present

## 2022-02-04 DIAGNOSIS — M8580 Other specified disorders of bone density and structure, unspecified site: Secondary | ICD-10-CM

## 2022-02-07 DIAGNOSIS — M25571 Pain in right ankle and joints of right foot: Secondary | ICD-10-CM | POA: Diagnosis not present

## 2022-02-12 DIAGNOSIS — M25571 Pain in right ankle and joints of right foot: Secondary | ICD-10-CM | POA: Diagnosis not present

## 2022-02-14 DIAGNOSIS — M25571 Pain in right ankle and joints of right foot: Secondary | ICD-10-CM | POA: Diagnosis not present

## 2022-02-18 DIAGNOSIS — M25571 Pain in right ankle and joints of right foot: Secondary | ICD-10-CM | POA: Diagnosis not present

## 2022-02-21 DIAGNOSIS — M25571 Pain in right ankle and joints of right foot: Secondary | ICD-10-CM | POA: Diagnosis not present

## 2022-02-25 DIAGNOSIS — M25571 Pain in right ankle and joints of right foot: Secondary | ICD-10-CM | POA: Diagnosis not present

## 2022-03-04 DIAGNOSIS — M25571 Pain in right ankle and joints of right foot: Secondary | ICD-10-CM | POA: Diagnosis not present

## 2022-03-11 DIAGNOSIS — M25571 Pain in right ankle and joints of right foot: Secondary | ICD-10-CM | POA: Diagnosis not present

## 2022-03-14 DIAGNOSIS — M25571 Pain in right ankle and joints of right foot: Secondary | ICD-10-CM | POA: Diagnosis not present

## 2022-03-18 DIAGNOSIS — M25571 Pain in right ankle and joints of right foot: Secondary | ICD-10-CM | POA: Diagnosis not present

## 2022-03-21 DIAGNOSIS — M25571 Pain in right ankle and joints of right foot: Secondary | ICD-10-CM | POA: Diagnosis not present

## 2022-03-25 DIAGNOSIS — Z09 Encounter for follow-up examination after completed treatment for conditions other than malignant neoplasm: Secondary | ICD-10-CM | POA: Diagnosis not present

## 2022-03-25 DIAGNOSIS — L638 Other alopecia areata: Secondary | ICD-10-CM | POA: Diagnosis not present

## 2022-03-25 DIAGNOSIS — M25571 Pain in right ankle and joints of right foot: Secondary | ICD-10-CM | POA: Diagnosis not present

## 2022-04-07 DIAGNOSIS — M25571 Pain in right ankle and joints of right foot: Secondary | ICD-10-CM | POA: Diagnosis not present

## 2022-04-10 DIAGNOSIS — M25571 Pain in right ankle and joints of right foot: Secondary | ICD-10-CM | POA: Diagnosis not present

## 2022-04-15 DIAGNOSIS — M25571 Pain in right ankle and joints of right foot: Secondary | ICD-10-CM | POA: Diagnosis not present

## 2022-04-18 DIAGNOSIS — M19071 Primary osteoarthritis, right ankle and foot: Secondary | ICD-10-CM | POA: Diagnosis not present

## 2022-04-18 DIAGNOSIS — M25371 Other instability, right ankle: Secondary | ICD-10-CM | POA: Diagnosis not present

## 2022-08-26 DIAGNOSIS — Z01419 Encounter for gynecological examination (general) (routine) without abnormal findings: Secondary | ICD-10-CM | POA: Diagnosis not present

## 2022-10-22 ENCOUNTER — Other Ambulatory Visit: Payer: Self-pay | Admitting: Internal Medicine

## 2022-10-22 DIAGNOSIS — Z0000 Encounter for general adult medical examination without abnormal findings: Secondary | ICD-10-CM

## 2022-11-04 DIAGNOSIS — Z1389 Encounter for screening for other disorder: Secondary | ICD-10-CM | POA: Diagnosis not present

## 2022-11-04 DIAGNOSIS — M8580 Other specified disorders of bone density and structure, unspecified site: Secondary | ICD-10-CM | POA: Diagnosis not present

## 2022-11-13 DIAGNOSIS — Z1331 Encounter for screening for depression: Secondary | ICD-10-CM | POA: Diagnosis not present

## 2022-11-13 DIAGNOSIS — Z23 Encounter for immunization: Secondary | ICD-10-CM | POA: Diagnosis not present

## 2022-11-13 DIAGNOSIS — Z1339 Encounter for screening examination for other mental health and behavioral disorders: Secondary | ICD-10-CM | POA: Diagnosis not present

## 2022-11-13 DIAGNOSIS — Z0000 Encounter for general adult medical examination without abnormal findings: Secondary | ICD-10-CM | POA: Diagnosis not present

## 2022-12-01 ENCOUNTER — Ambulatory Visit
Admission: RE | Admit: 2022-12-01 | Discharge: 2022-12-01 | Disposition: A | Discharge status: Home / Self Care | Payer: BC Managed Care – PPO | Source: Ambulatory Visit | Attending: Internal Medicine

## 2022-12-01 DIAGNOSIS — Z1231 Encounter for screening mammogram for malignant neoplasm of breast: Secondary | ICD-10-CM | POA: Diagnosis not present

## 2022-12-01 DIAGNOSIS — Z0000 Encounter for general adult medical examination without abnormal findings: Secondary | ICD-10-CM

## 2023-03-04 DIAGNOSIS — H5203 Hypermetropia, bilateral: Secondary | ICD-10-CM | POA: Diagnosis not present

## 2023-03-04 DIAGNOSIS — H2513 Age-related nuclear cataract, bilateral: Secondary | ICD-10-CM | POA: Diagnosis not present

## 2023-03-19 DIAGNOSIS — J019 Acute sinusitis, unspecified: Secondary | ICD-10-CM | POA: Diagnosis not present

## 2023-04-03 DIAGNOSIS — Z860100 Personal history of colon polyps, unspecified: Secondary | ICD-10-CM | POA: Diagnosis not present

## 2023-06-05 DIAGNOSIS — Z09 Encounter for follow-up examination after completed treatment for conditions other than malignant neoplasm: Secondary | ICD-10-CM | POA: Diagnosis not present

## 2023-06-05 DIAGNOSIS — Z860101 Personal history of adenomatous and serrated colon polyps: Secondary | ICD-10-CM | POA: Diagnosis not present

## 2023-11-09 ENCOUNTER — Other Ambulatory Visit: Payer: Self-pay | Admitting: Internal Medicine

## 2023-11-09 DIAGNOSIS — Z1231 Encounter for screening mammogram for malignant neoplasm of breast: Secondary | ICD-10-CM

## 2023-11-17 DIAGNOSIS — E559 Vitamin D deficiency, unspecified: Secondary | ICD-10-CM | POA: Diagnosis not present

## 2023-11-17 DIAGNOSIS — R7989 Other specified abnormal findings of blood chemistry: Secondary | ICD-10-CM | POA: Diagnosis not present

## 2023-11-17 DIAGNOSIS — Z0000 Encounter for general adult medical examination without abnormal findings: Secondary | ICD-10-CM | POA: Diagnosis not present

## 2023-11-17 DIAGNOSIS — Z0189 Encounter for other specified special examinations: Secondary | ICD-10-CM | POA: Diagnosis not present

## 2023-11-24 DIAGNOSIS — Z1339 Encounter for screening examination for other mental health and behavioral disorders: Secondary | ICD-10-CM | POA: Diagnosis not present

## 2023-11-24 DIAGNOSIS — Z1331 Encounter for screening for depression: Secondary | ICD-10-CM | POA: Diagnosis not present

## 2023-11-24 DIAGNOSIS — Z0000 Encounter for general adult medical examination without abnormal findings: Secondary | ICD-10-CM | POA: Diagnosis not present

## 2023-11-24 DIAGNOSIS — Z23 Encounter for immunization: Secondary | ICD-10-CM | POA: Diagnosis not present

## 2023-12-02 ENCOUNTER — Ambulatory Visit
Admission: RE | Admit: 2023-12-02 | Discharge: 2023-12-02 | Disposition: A | Discharge status: Home / Self Care | Source: Ambulatory Visit | Attending: Internal Medicine | Admitting: Internal Medicine

## 2023-12-02 DIAGNOSIS — Z1231 Encounter for screening mammogram for malignant neoplasm of breast: Secondary | ICD-10-CM | POA: Diagnosis not present
